# Patient Record
Sex: Female | Born: 1945 | Race: White | Hispanic: No | Marital: Married
Health system: Midwestern US, Community
[De-identification: ages and names within clinical notes are randomized; demographics above are authoritative.]

## PROBLEM LIST (undated history)

## (undated) DIAGNOSIS — M19019 Primary osteoarthritis, unspecified shoulder: Secondary | ICD-10-CM

---

## 2005-11-25 ENCOUNTER — Inpatient Hospital Stay: Attending: Diagnostic Radiology

## 2006-07-07 NOTE — Unmapped (Signed)
Signed by Julious Payer RN on 07/07/2006 at 13:25:16    Phone Note   Patient Call  Call back at Home Phone: [phone]  Caller: patient  Department: Otolaryngology  Call for: ent nurse    Summary of Call: [facility] in [place] is Dr. Berna Spare.  Ms. Mirkin saw him on the street yesterday.     Initial call taken by: Syliva Overman,  July 07, 2006 11:32 AM      Follow-up for Phone Call   Dr Andrena Mews was made aware of the message.  Follow-up by: Julious Payer RN,  July 07, 2006 1:25 PM

## 2006-12-23 ENCOUNTER — Inpatient Hospital Stay: Attending: Diagnostic Radiology

## 2009-03-12 NOTE — Op Note (Unsigned)
PATIENT NAME                  PA #             MR #                  Catherine Roth, Catherine Roth              [id number]       [id number]            SURGEON                               SURG DATE   DIS DATE           Kallie Edward, MD                  03/12/2009                     DATE OF BIRTH    AGE            PATIENT TYPE      RM #               Apr 15, 1946       63             SDA                                     PROCEDURE:  Colonoscopy.     INDICATIONS:  The patient is here for screening colonoscopy.  She is 63 years  of age.  She had a colonoscopy 10 years ago.  Colonoscopy is being done to  make sure that no significant pathology has developed.  She denies any  symptoms.         Following a preoperative assessment of the patient and review of her  pertinent medical history and medications, an informed consent was obtained.   Premedication was administered slowly in incremental dosages.  The patient  was monitored with pulse oximetry and sequential blood pressure checks.     MEDICATIONS:  Fentanyl 75 micrograms and Versed 5 mg.       DETAILS OF PROCEDURE:  The scope was advanced in the cooperative patient  ultimately to the base of the cecum.  The usual cecal landmarks were  identified.  The cecum, ascending colon, hepatic flexure, transverse,  splenic, descending, sigmoid and rectum were all carefully examined.  Prep  was good.  One miniscule questionable polyp was seen in the ascending colon  which was obliterated with 2 bites of the biopsy forceps.  On withdrawal  through the sigmoid colon, a very mild sigmoid diverticulosis was seen.  This  was incidental.  Retroflexion was negative.  No evidence for significant  pathology was seen.  Some external hemorrhoids were present.       PLAN:  A followup exam at this point can be done on a 10-year interval for  screening if biopsies are negative for adenomas and 5 years if there is a  polyp.                                      Kallie Edward, MD     Derwood Kaplan  /[id number]  DD: 03/12/2009  09:33  DT: 03/12/2009 13:16  Job #: [id number]  CC: Kallie Edward, MD  CC: Elon Spanner, MD

## 2011-01-13 NOTE — Procedures (Signed)
[facility] [address]  [place], [place] [address]  [phone]     Electrodiagnostic Medicine  Rehabilitation Services Department      Exam Date: 01/11/2011  Name: Catherine Roth, Catherine Roth Exam Time: 10:14 AM  MR#: [id number] Patient ID: [id number]  Gender:  Date of Birth: [birth year]    Age: 65    Referring Physician: Gladstone Pih M.D.       Clinical Problem:   Patient is a 65 yo WF with hyperlipidemia and prior right THR about 5 yrs. ago.  She complains of right foot drop past six mos. with slight numbness dorsal aspect of foot.  She admits to frequent leg crossing and notes minimal low back pain.  PE reveals weakness right ankle DF/eversion/EHL (4/5).  Reflexes are 1+.      Summary:   1. NCS reveal diminished amplitude of right deep peroneal motor evoked response with associated slowing of right deep peroneal motor nerve conduction velocities.  Moderate right deep peroneal motor conduction block (drop in amplitude) of about 40% is noted across the right knee.  Right superficial peroneal sensory evoked response is mildly diminished in amplitude.   2. Needle EMG reveals mild chronic muscle membrane irritability within distal muscles of the right lower extremity.  Mildly enlarged, polyphasic, voluntary motor unit potentials with slight decreased recruitment are noted in right L5S1 innervated muscles.     Impression:   1. 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    2. 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  Mild chronic ongoing denervation is noted in distal muscles of the right lower extremity.  Chronic reinnervation is noted in all right L5S1 innervated muscles tested.  Difficult to entirely exclude a mild chronic right lumbosacral plexopathy causing similar findings due to normal paraspinal exam although less likely.   3. No evidence of myopathy, diffuse peripheral neuropathy or other focal mononeuropathy involving the right lower extremity.       (Note:  If this report is being viewed under CHART REVIEW/PROCEDURES, table format/data can be better viewed using NOTES/PROCEDURES)    Thank you.             Violet Seabury B. Hal Hope, M.D.      cc:  Dr. Lyda Perone    Motor Nerve Conduction:            Nerve and Site   Lat  ms Amp  mV Segment   Dist  mm Lat Diff  ms CV   m/s   Peroneal.R to Extensor digitorum brevis.R      Ankle 5.5 1.0 Extensor digitorum brevis-Ankle 80 5.5    Fibula (head) 13.7 0.9 Ankle-Fibula (head) 300 8.2 37   Popliteal fossa 16.2 0.6 Ankle-Popliteal fossa 390 10.7 36   Tibial.R to Abductor hallucis.R      Ankle 5.0 6.5 Abductor hallucis-Ankle 80 5.0    Popliteal fossa 13.2 6.1 Ankle-Popliteal fossa 350 8.2 43     Nerve Lat ? ms Amp ? mV CV ? m/s  Median 4.2 4 50  Ulnar 4.2 4 50  Peroneal 5.5 2 40  Tibial 6.1 4 40    F-waves:        Nerve   M-Lat  ms F-Lat  ms        Peroneal.R  NR        Tibial.R  52.7      Sensory and Mixed Nerve Conduction:             Nerve and Site  Peak  Lat ms Amp  V Segment   Lat Diff  ms Dist  mm   Peroneal.R to Ankle.R      Lower leg 3.3 7 Ankle-Lower leg  100   Sural.R to Ankle.R      Lower leg 3.8 6 Ankle-Lower leg  140     Nerve Peak Lat ? ms Amp ? ??V   Median 3.6 20  Ulnar 3.7 20  Radial 2.8 10  Sural 4.0 5  Peroneal 3.5 8    Needle EMG Examination:      Muscle Insertion Spontaneous Activity Volutional MUAPs Comments    Activity Fibs PSW Fasc Other Effort Recruit Dur Amp Poly     Vastus medialis.R Normal 0 0 None  Normal Normal Normal Normal None    Tibialis anterior.R Increased 1+ 1+ None  Normal Reduced Sl Incr Sl Incr Few    Peroneus longus.R Increased 1+ 1+ None  Normal Reduced Sl Incr Sl Incr Few    Gastrocnemius (Medial head).R Increased 1+ 0 None  Normal Reduced Sl Incr Sl Incr Few    Tibialis posterior.R Increased 1+ 0 None  Normal Reduced Sl Incr Sl Incr Few    Biceps femoris (short head).R Normal 0 0 None  Normal Reduced Sl Incr Sl Incr Rare    Gluteus medius.R Normal 0 0 None  Normal Reduced Sl Incr Sl Incr  Rare    Gluteus maximus.R Normal 0 0 None  Normal Reduced Sl Incr Sl Incr Rare    Lumbar paraspinals: .R Normal 0 0 None

## 2012-04-14 NOTE — Unmapped (Signed)
patient called to request an appointment with Dr Lahoma Rocker for evaluation of elevated liver enzymes.  Records from Dr Carollee Massed are being sent to fax # [phone].  patient can be reached at [phone] (home)

## 2012-04-17 NOTE — Unmapped (Signed)
Left a message for patient on her answering machine with available date and time.  Left my direct number for her to confirm appt date and time.  Sent visit info to patient.

## 2012-05-14 ENCOUNTER — Inpatient Hospital Stay
Admit: 2012-05-14 | Discharge: 2012-05-14 | Disposition: A | Discharge status: Home / Self Care | Attending: Emergency Medicine

## 2012-05-14 MED ORDER — BACITRACIN-POLYMYXIN B 500-10000 UNIT/GM EX OINT
500-10000 UNIT/GM | Freq: Once | CUTANEOUS | Status: AC
Start: 2012-05-14 — End: 2012-05-14
  Administered 2012-05-14: 21:00:00 via TOPICAL

## 2012-05-14 MED ORDER — LIDOCAINE-EPINEPHRINE 1 %-1:100000 IJ SOLN
1 %-:00000 | Freq: Once | INTRAMUSCULAR | Status: AC
Start: 2012-05-14 — End: 2012-05-14
  Administered 2012-05-14: 21:00:00 via INTRADERMAL

## 2012-05-14 MED FILL — LIDOCAINE-EPINEPHRINE 1 %-1:100000 IJ SOLN: 1 %-:00000 | INTRAMUSCULAR | Qty: 30

## 2012-05-14 MED FILL — DOUBLE ANTIBIOTIC 500-10000 UNIT/GM EX OINT: 500-10000 UNIT/GM | CUTANEOUS | Qty: 1

## 2012-05-14 NOTE — ED Provider Notes (Addendum)
Date of evaluation: 05/14/2012    Chief Complaint   Head Laceration      Nursing Notes, Past Medical Hx, Past Surgical Hx, Social Hx, Allergies, and Family Hx were all reviewed and agreed with, or any disagreements were addressed in the HPI.    History of Present Illness     Catherine Roth is a 66 y.o. caucasian female with a past medical history of depression and bipolar disorder who presents to the emergency department with 3 small lacerations to her left forehead secondary to a fall while walking her dog this afternoon.  Patient states she tripped over a rock causing her to fall.  She denies any loss of consciousness, visual changes, headache, neck pain, nausea or vomiting, or any other associated symptoms.  Patient states she hasn't eaten anything for the pain thus far.     Review of Systems     Patient denies any chest pain, palpitations, shortness of breath, cough, hemoptysis,abdominal pain, nausea, vomiting, diarrhea, constipation, bowel or bladder incontinence, urinary symptoms including dysuria, urgency, frequency or hematuria. Patient denies any dizziness, lightheadedness, headaches, visual changes, blurred vision, photophobia or syncope. Patient denies any neck pain or stiffness, back pain, paresthesias or weakness.  Remainder of review of systems was reviewed with the patient and was otherwise negative per patient.    Past Medical, Surgical, Family, and Social History     History reviewed. No pertinent past medical history.      Procedure Laterality Date   ??? Hip surgery     ??? Hysterectomy     ??? Tonsillectomy       Her family history is not on file.  She reports that she has never smoked. She does not have any smokeless tobacco history on file. She reports that  drinks alcohol. She reports that she uses illicit drugs.    Medications     Previous Medications    BUPROPION SR (WELLBUTRIN SR) 150 MG SR TABLET    Take 150 mg by mouth daily.    LITHIUM 150 MG CAPSULE    Take 150 mg by mouth 3 times  daily (with meals).    THERAPEUTIC MULTIVITAMIN-MINERALS (THERAGRAN-M) TABLET    Take 1 tablet by mouth daily.       Allergies     She  has no known allergies.    Physical Exam     INITIAL VITALS: BP 126/68   Pulse 92   Temp(Src) 97.7 ??F (36.5 ??C)   Resp 12   Ht 5\' 6"  (1.676 m)   Wt 122 lb (55.339 kg)   BMI 19.7 kg/m2   SpO2 96%   General:   Alert and oriented.  In no acute distress.    HEENT:  Normocephalic, atraumatic.  Sclera are clear bilaterally. PERRLA bilaterally. EOMI bilaterally.No evidence of extraocular motion abnormalities. No obvious step off of the orbit. Oral mucosa is pink and moist without lesions.  Pharynx without erythema or exudate noted.     Neck: Supple.  No lymphadenopathy noted.  Trachea midline.    Pulmonary: Lungs clear to auscultation bilaterally.  No wheezes, rales, rhonchi.     Cardiac: Regular rate and rhythm.  No murmurs, rubs, clicks, gallops.    Abdomen: Soft, nontender, nondistended.  No guarding or rebound.  Positive active bowel sounds in all 4 quadrants.    Musculoskeletal: 2+ pulses to bilateral upper and lower extremities.  Patient is moving all extremities with full range of motion and 5 out of 5 muscle strength.  No  C-spine, T-spine, L-spine tenderness to palpation.  No extremity edema noted.      Vascular:  Good capillary refill.  No cyanosis.     Skin: The superolateral portion of the left orbit has 2 lacerations, one measures approx. 1 cm and is a stellate type lesion and the more lateral laceration is a 1 cm linear lesion, both of which are hemostatic.    Neuro: Alert and oriented x3.  Moving all extremities symmetrically.  Gross sensation and motor is intact.  Ambulating without any difficulty.    ED Course     The patient was given the following medications:  Orders Placed This Encounter   Medications   ??? lidocaine-EPINEPHrine 1 %-1:100000 injection 20 mL     Sig:    ??? bacitracin-polymyxin b (POLYSPORIN) ointment     Sig:      Laceration Repair Procedure  Note    Indication: Laceration    Procedure:  After verbal informed consent was obtained, the patient was placed in the appropriate position and anesthesia around the lacerations were obtained by infiltration lidocaine with epi. The area was then cleaned and irrigated thoroughly. The lacerations was repaired using 5-0 prolene in a simple interrupted fashion. 2 sutures were placed in the stellate shaped lacerations and 4 sutures were placed in the linear laceration.The wound area was then dressed with bacitracin and a clean dressing.    Total repaired wound length: linear laceration was 1.7cm after closure.     The patient tolerated the procedure well.    Complications: None    Catherine Roth is a 65 y.o. female who was out walking her dog when she tripped and fell sustaining 2 small lacerations to her left lateral brow region. Both of the lacerations were repaired with sutures. Good cosmetic results were obtained. Given the patients history we did not feel a Head CT was neccesary at this time. Patient thinks she is up to date on her tetanus but will double check with her PCP. Patient denies any other injury and is walking without difficulty.  Patient will be discharge home in stable condition with wound care instructions.  These instructions were reviewed with the patient at the bedside and she verbalized understanding. Instructed patient to have sutures removed in 7 days and patient states she will be visiting her soon in [place] who is a physician therefore will have him remove them.     The patient tolerated their visit well.  The patient and / or the family were informed of the results of any tests, a time was given to answer questions, a plan was proposed and they agreed with plan.      Patient seen and examined by the attending physician Thomasene Ripple, MD who agrees with my assessment and treatment and plan.    Clinical Impression     1. Fall from other slipping, tripping, or stumbling    2. Facial  laceration        Disposition/Plan     PATIENT REFERRED TO:  Elon Spanner    In 7 days  To have stitches removed      DISCHARGE MEDICATIONS:  New Prescriptions    No medications on file       DISPOSITION Decision to Discharge    ED Attending Attestation Note     Date of evaluation: 05/14/2012    This patient was seen by the mid-level provider.  I have seen and examined the patient, agree with the workup, evaluation, management and  diagnosis. The care plan has been discussed.  My assessment reveals a well-developed, well-nourished Caucasian female in no acute distress.  The superolateral portion of the left orbit has 2 lacerations with a less than 1 cm stellate lesion and a 1 cm linear lesion, both of which are hemostatic.  No evidence of extraocular motion abnormalities.  No obvious step off of the orbit.    I was present for all key portions of the laceration repair by the mid-level provider.    The patient believes that her tetanus status is likely up-to-date and rechecking with her primary care office      Thomasene Ripple, MD  05/14/12 1620    Arelia Sneddon, PA-C  05/19/12 0981    Arelia Sneddon, PA-C  05/20/12 2043

## 2012-05-14 NOTE — ED Notes (Signed)
pso and dressing applied. Pt given all discharge instructions and follow up care. Pt acknowledged understanding of instructions and was able to ambulate out of the ER in stable condition.    Glade Stanford, RN  05/14/12 1721

## 2012-05-14 NOTE — Discharge Instructions (Signed)
1. Apply Polysporin to left facial lacerations multiple times per day.  2. Clean the wounds twice daily with antibacterial soap  3. Change dressing as needed  4. Followup with your primary care physician in 7 days to have sutures removed.  5. Return to the emergency department if you develop any worsening symptoms or concerns including fever greater than 101, increased redness, pus drainage or if you have any other concerns  6. Follow up with her primary care physician to determine if you need your tetanus vaccination updated    Laceration Care, Adult  A laceration is a cut or lesion that goes through all layers of the skin and into the tissue just beneath the skin.  TREATMENT   Some lacerations may not require closure. Some lacerations may not be able to be closed due to an increased risk of infection. It is important to see your caregiver as soon as possible after an injury to minimize the risk of infection and maximize the opportunity for successful closure.  If closure is appropriate, pain medicines may be given, if needed. The wound will be cleaned to help prevent infection. Your caregiver will use stitches (sutures), staples, wound glue (adhesive), or skin adhesive strips to repair the laceration. These tools bring the skin edges together to allow for faster healing and a better cosmetic outcome. However, all wounds will heal with a scar. Once the wound has healed, scarring can be minimized by covering the wound with sunscreen during the day for 1 full year.  HOME CARE INSTRUCTIONS   For sutures or staples:   Keep the wound clean and dry.   If you were given a bandage (dressing), you should change it at least once a day. Also, change the dressing if it becomes wet or dirty, or as directed by your caregiver.   Wash the wound with soap and water 2 times a day. Rinse the wound off with water to remove all soap. Pat the wound dry with a clean towel.   After cleaning, apply a thin layer of the antibiotic  ointment as recommended by your caregiver. This will help prevent infection and keep the dressing from sticking.   You may shower as usual after the first 24 hours. Do not soak the wound in water until the sutures are removed.   Only take over-the-counter or prescription medicines for pain, discomfort, or fever as directed by your caregiver.   Get your sutures or staples removed as directed by your caregiver.  For skin adhesive strips:   Keep the wound clean and dry.   Do not get the skin adhesive strips wet. You may bathe carefully, using caution to keep the wound dry.   If the wound gets wet, pat it dry with a clean towel.   Skin adhesive strips will fall off on their own. You may trim the strips as the wound heals. Do not remove skin adhesive strips that are still stuck to the wound. They will fall off in time.  For wound adhesive:   You may briefly wet your wound in the shower or bath. Do not soak or scrub the wound. Do not swim. Avoid periods of heavy perspiration until the skin adhesive has fallen off on its own. After showering or bathing, gently pat the wound dry with a clean towel.   Do not apply liquid medicine, cream medicine, or ointment medicine to your wound while the skin adhesive is in place. This may loosen the film before your wound is  healed.   If a dressing is placed over the wound, be careful not to apply tape directly over the skin adhesive. This may cause the adhesive to be pulled off before the wound is healed.   Avoid prolonged exposure to sunlight or tanning lamps while the skin adhesive is in place. Exposure to ultraviolet light in the first year will darken the scar.   The skin adhesive will usually remain in place for 5 to 10 days, then naturally fall off the skin. Do not pick at the adhesive film.  You may need a tetanus shot if:   You cannot remember when you had your last tetanus shot.   You have never had a tetanus shot.  If you get a tetanus shot, your arm may swell,  get red, and feel warm to the touch. This is common and not a problem. If you need a tetanus shot and you choose not to have one, there is a rare chance of getting tetanus. Sickness from tetanus can be serious.  SEEK MEDICAL CARE IF:    You have redness, swelling, or increasing pain in the wound.   You see a red line that goes away from the wound.   You have yellowish-white fluid (pus) coming from the wound.   You have a fever.   You notice a bad smell coming from the wound or dressing.   Your wound breaks open before or after sutures have been removed.   You notice something coming out of the wound such as wood or glass.   Your wound is on your hand or foot and you cannot move a finger or toe.  SEEK IMMEDIATE MEDICAL CARE IF:    Your pain is not controlled with prescribed medicine.   You have severe swelling around the wound causing pain and numbness or a change in color in your arm, hand, leg, or foot.   Your wound splits open and starts bleeding.   You have worsening numbness, weakness, or loss of function of any joint around or beyond the wound.   You develop painful lumps near the wound or on the skin anywhere on your body.  MAKE SURE YOU:    Understand these instructions.   Will watch your condition.   Will get help right away if you are not doing well or get worse.  Document Released: 07/05/2005 Document Revised: 09/27/2011 Document Reviewed: 12/29/2010  [facility] Patient Information 2013 [place], [place].

## 2012-05-24 ENCOUNTER — Encounter

## 2012-08-16 ENCOUNTER — Encounter

## 2012-10-11 ENCOUNTER — Ambulatory Visit

## 2013-01-02 NOTE — Telephone Encounter (Signed)
Wife of patient calling.

## 2013-01-02 NOTE — Telephone Encounter (Signed)
Pt is calling to adv JSH "anything that he is needs, I am willing to help him"

## 2014-01-15 ENCOUNTER — Institutional Professional Consult (permissible substitution): Admit: 2014-01-15 | Payer: MEDICARE

## 2014-01-15 DIAGNOSIS — F5081 Binge eating disorder: Secondary | ICD-10-CM

## 2014-01-15 NOTE — Unmapped (Signed)
Pt here for visit. She stopped the Vyvanse in Feb 2015, per my instructions, because she was agitated. Her agitation subsequently resolved. She conts to  binge eating about 2 times/mo for 3-4 days; she describes constant eating during those times. Her weight cycles some as a result. Her binge eating tends to occur when she is stressed or depressed.Regarding mood, she has occasional sad spells during which she misses Lloyd Huger, but no other depressive symptoms. She is sleeping well with the lorazepam. She is functioning well and continues to have great relationships with her children and grandchildren.     MSE: Neatly dressed; good eye contact;  normal mood, affect, and speech; no dels or hals; memory grossly intact;  no SI or HI    ROS: She is in excellent medical health except for some calcification in her heart. She has no known drug allergies. She has recently developed some trouble swallowing and is being evaluated by a GI specialist. She had a normal barium swallow and is to have an endoscopy in August. She has no side effects.     Assessment and Management Plan (Prescribers):   1. dysregulated eating behavior -  cont Wellbutrin, fluoxetine, psychotherapy  2. depressive symptoms - cont Wellbutrin and flouxetine  3. insomnia-cont lorazepam .5 mg every night; cont to stress good sleep hygiene; again warned of side effects of sedation, confusion, and impaired driving  4. impaired attention and focus-to monitor  5. mild obsessive compulsive symptoms (nail biting) - cont to monitor

## 2014-05-15 ENCOUNTER — Ambulatory Visit: Admit: 2014-05-15 | Payer: MEDICARE

## 2014-05-15 DIAGNOSIS — F330 Major depressive disorder, recurrent, mild: Secondary | ICD-10-CM

## 2014-05-15 NOTE — Unmapped (Signed)
Pt here for visit at the recommendation of her internist (her last appt with me was 4 months ago). When she saw Dr Orvil Feil 2 weeks ago, she was feeling depressed and Dr Orvil Feil recommended that she talk to me about Gene Sight testing. Since then, she has started Vitamin D 2000 units/day (for a low Vitamin D level and an 8% bone loss since last year) and started going to bed earlier at 10 PM, and has felt much better.  She is binge eating about 2 times/week. Her weight cycles some as a result but is normal today. Marland Kitchen Her binge eating tends to occur when she is stressed or depressed. She is sleeping well with the lorazepam, and now that she is going to bed at 10 PM, feels refreshed in the AM.. She is functioning well and continues to have great relationships with her children and grandchildren.     Time: 30 minutes    MSE: Neatly dressed; good eye contact;  normal mood, affect, and speech; no dels or hals; memory grossly intact;  no SI or HI    ROS: She is in good  medical health except for some calcification in her heart,  recently discovered low vitamin D, and 8% bone loss since last year.  She has no side effects. Weight=127.8 lbs.    Assessment and Management Plan (Prescribers):   1. dysregulated eating behavior -  cont Wellbutrin and fluoxetine  2. depressive symptoms - cont Wellbutrin and fluoxetine; cont Vitamin D;  GeneSight testing obtained today.  3. insomnia-cont lorazepam .5 mg every night; cont to stress good sleep hygiene; again warned of side effects of sedation, confusion, and impaired driving  4. impaired attention and focus-to monitor  5. mild obsessive compulsive symptoms (nail biting) - cont to monitor

## 2014-05-29 ENCOUNTER — Ambulatory Visit: Payer: PRIVATE HEALTH INSURANCE

## 2014-05-30 ENCOUNTER — Ambulatory Visit: Admit: 2014-05-30 | Payer: MEDICARE

## 2014-05-30 DIAGNOSIS — F5081 Binge eating disorder: Secondary | ICD-10-CM

## 2014-05-30 NOTE — Unmapped (Signed)
Pt here for visit because of increased binge eating and and concerns about her ADD. She states My ADD is interfering with my life.  She also says that when she stayed with her grand children last weekend, she binged much of the time and gained 7.5 lbs in 4 days, which lead to depressed mood and social isolation. Now that she is home, she is in complete control of her eating and her mood is much improved. (She describes her depression at this time as having a bad hour on some days, especially when she drives.) She describes her ADD symptoms as doing too many things at once (though she gets them all done). She has trouble reading but can read a book that interests her.  It is hard for her to sit still.   Lately, her binge eating tends to occur when she is babysitting her grandchildren.  She is sleeping well with the lorazepam and conts to bed at 10 PM. She is functioning well and continues to have great relationships with her children and grandchildren.     Psychotherapy: Continues to worry about how much time she has to live. Discussed that she is a chaotic person who does not do well in chaotic environments  We reviewed her GeneSight testing. She is an ultra-rapid metabolizer of fluoxetine but her bupropion level may be too high and she is at risk for side effects.  She would like to see how she does with decreasing the Wellbutrin XL to 150 mg/d.    Time: 30 minutes; 18 minutes psychotherapy    MSE: Neatly dressed; good eye contact;  normal mood, affect, and speech; no dels or hals; memory grossly intact;  no SI or HI    ROS: She is in good medical health except for some calcification in her heart,  recently discovered low vitamin D, and 8% bone loss since last year.  She has no side effects, except possibly for increased ADD symptoms and anxiety/excessive worrying. Weight=129.4 lbs. She remains on Vitamin D 2000 units/day.    Assessment and Management Plan (Prescribers):   1. dysregulated eating behavior -   Decrease Wellbutrin XL to 150 mg/d  and cont fluoxetine 60 mg/day  2. depressive symptoms - decrease Wellbutrin and cont fluoxetine; cont Vitamin D;  GeneSight testing reviewed with patient today.  3. insomnia-cont lorazepam .5 mg every night; cont to stress good sleep hygiene; again warned of side effects of sedation, confusion, and impaired driving  4. impaired attention and focus- Decrease Wellbutrin XL  to 150 mg/d and cont  to monitor  5. Possible excessive worrying-to decrease Wellbutrin XL to 150 mg/day and to monitor.  6. mild obsessive compulsive symptoms (nail biting) - cont to monitor  7. Ongoing grief issues related to the death of her husband-cont psychotherapy.

## 2014-06-18 ENCOUNTER — Ambulatory Visit: Admit: 2014-06-18

## 2014-06-18 DIAGNOSIS — F3181 Bipolar II disorder: Secondary | ICD-10-CM

## 2014-06-18 NOTE — Unmapped (Signed)
Pt here for visit. She decreased the Wellbutrin XL to 150 mg/day and has since felt more depressed. In addition, over the past 4 days, she has felt wired, has been unable to sit still, has had trouble sleeping, had racing thoughts, and has had constant ideas coming to her mind about an upcoming film about her husband. Her energy was increased but it was not a good energy. She refers to these symptoms as her ADD symptoms, but says they are episodic and agrees that they may represent hypomania/rapid cycling.  During these times she feels scared because she is so out of control and binges. She is functioning well and continues to have great relationships with her children and grandchildren, but is isolating herself from her friends.     Psychotherapy: Discussed her painful childhood, how her mother screamed at her all the time, and how she can always rally and be normal when she is around people, but then feels like a fake. Discussed that she may have bipolar disorder and that we should try a mood stabilizer again. She does not want to take valproate or an antipsychotic because of the weight gain. Decided she would like to try Dierdre Searles again-when tried this in the past, it was helpful but she became Li toxic, and we could never figure out why.     Time: 50 minutes; 30 minutes psychotherapy    MSE: Neatly dressed; good eye contact;  normal mood and affect but speech somewhat tangential; thought process suggests flight of ideas-she quickly tzlks about different topics.no dels or hals; memory grossly intact;  no SI or HI    ROS: She is in good medical health except for some calcification in her heart,  recently discovered low vitamin D, and 8% bone loss since last year.  She has no side effects. Her bllod work from her recent annual visit with Dr. Carollee Massed was WNLs. She remains on Vitamin D 2000 units/day.    Assessment and Management Plan (Prescribers):   1. dysregulated eating behavior -  Cont Wellbutrin XL 150 mg/d  and  cont fluoxetine 60 mg/day  2. depressive symptoms - add lithium 150 mg AM; cont Wellbutrin and cont fluoxetine; cont Vitamin D;  Get Li level and TFTs in 1 week. Return 2 weeks.  3. insomnia-cont lorazepam .5 mg every night; cont to stress good sleep hygiene; again warned of side effects of sedation, confusion, and impaired driving  4. impaired attention and focus- Cont Wellbutrin XL  150 mg/d and observe while on Li  5. Possible excessive worrying-Cont Wellbutrin XL  150 mg/day and observe while on Li.  6. mild obsessive compulsive symptoms (nail biting) - cont to monitor  7. Ongoing grief issues related to the death of her husband-cont psychotherapy  8. Past history of Li toxicity of unknown cause. Monitor closely, get Li level in 1 week

## 2014-07-04 ENCOUNTER — Ambulatory Visit: Payer: MEDICARE

## 2014-07-05 ENCOUNTER — Ambulatory Visit: Admit: 2014-07-05 | Payer: MEDICARE

## 2014-07-05 DIAGNOSIS — F3181 Bipolar II disorder: Secondary | ICD-10-CM

## 2014-07-05 NOTE — Unmapped (Signed)
Pt here for visit. On Wellbutrin XL 300 mg/d, Prozac 60 mg/d, and Li 150 mg/d, the patient states she feels much better with no depression, hypomania, anxiety, or binge eating for the past week. She is sleeping well and has stopped buying things she does not need over the Internet. Her focus and concentration are better. She is socializing more, functioning well and continues to have great relationships with her children and grandchildren.      Time: 25 minutes     MSE: Neatly dressed; good eye contact;  normal mood and affect, normal speech,no dels or hals; memory grossly intact;  no SI or HI    ROS: She is in good medical health except for some calcification in her heart,  Has recently discovered low vitamin D, and 8% bone loss since last year.  She has no side effects except for mild tremor and mild cognitive impairment - it takes her longer to do crossword puzzles and she has had some trouble spelling .  She remains on Vitamin D 2000 units/day. Appetite and weight are stable.    Labs:  07/05/14 wt=129.4 lbs  06/28/14 Li=.2, TSH=.88, T4=5.18    Assessment and Management Plan (Prescribers):   1. dysregulated eating behavior -  Cont Wellbutrin XL 300 mg/d,  fluoxetine 60 mg/day, and lithium 150 mg/d; return 1 month  2. depressive symptoms - cont Wellbutrin XL, fluoxetine, and lithium; cont Vitamin D; return 1 month.  3. insomnia-cont lorazepam .5 mg every night; cont to stress good sleep hygiene; again warned of side effects of sedation, confusion, and impaired driving  4. impaired attention and focus- Cont Wellbutrin XL  300 mg/d and observe while on Li  5. Possible excessive worrying-Cont Wellbutrin XL  300 mg/day and observe while on Li.  6. mild obsessive compulsive symptoms (nail biting) - cont to monitor  7. Ongoing grief issues related to the death of her husband-cont psychotherapy  8. Past history of Li toxicity of unknown cause. Monitor closely.

## 2015-02-21 NOTE — Unmapped (Signed)
OP Treatment Plan    MKENZIE DOTTS  [id number]    [facility] TREATMENT PLAN:   General Goals:  Reduce risk of symptomatic/syndromic/functional relapse or recurrence. Patient will remain psychiatrically stable by reducing or stabilizing his/her signs and symptoms of mental illness and maximizing his/her level of independence. Patient will be able to recognize, accept, and manage his/her mental health and/or substance misuse disorders, including working with the medical staff to manage his/her medications. Improve symptoms/maintain symptom improvement.  Med/Som Specific:  Patient will report all signs and symptoms to the MD/NP. Patient will engage in an informed consent discussion regarding medications for the treatment of their symptoms. Patient will demonstrate adherence to the medication treatment that is established. Patient will report any side effect issues related to the medications.  Therapy Specific:  Patient will attend and be an active participant in individual therapy. Patient will address psychosocial stresses that impact development of symptoms in therapy. Patient will demonstrate increased knowledge and implementation of coping strategies to reduce symptoms and improve functioning.  Expected Frequency of Visits:  2x/year/Ongoing

## 2015-02-26 ENCOUNTER — Ambulatory Visit: Admit: 2015-02-26 | Payer: MEDICARE

## 2015-02-26 DIAGNOSIS — F3181 Bipolar II disorder: Secondary | ICD-10-CM

## 2015-02-26 NOTE — Unmapped (Addendum)
Pt here for visit. She remains on Wellbutrin XL 300 mg/d, Prozac 60 mg/d, Li 150 mg/d, and lorazepam .5 mg qH, and states she has occasional depression and binge eating when stressed, but otherwise is stable. She has had no hypomania or anxiety. The lorazepam continues to be very helpful for sleep. Her energy,  Focus,  and concentration are good. She is functioning well and continues to have great relationships with her children and grandchildren.      Time: 25 minutes     MSE: Neatly dressed; good eye contact;  normal mood and affect, normal speech,no dels or hals; memory grossly intact;  no SI or HI    ROS: She is in good medical health except for some calcification in her heart.  She has no side effects except for mild tremor (which is not apparent today) and mild cognitive impairment - it takes her longer to do crossword puzzles and she has had some trouble spelling.  She remains on Vitamin D 2000 units/day. Appetite and weight are stable.    Labs:  02/26/15 wt=126 lbs  07/05/14 wt=129.4 lbs  06/28/14 Li=.2, TSH=.88, T4=5.18    Assessment and Management Plan (Prescribers):   1. dysregulated eating behavior -  Cont Wellbutrin XL 300 mg/d,  fluoxetine 60 mg/day; Increase lithium 150 mg BID; get repeat labs in 10/16 when she has her annual physical  2. depressive symptoms - cont Wellbutrin XL, fluoxetine, increase lithium; cont Vitamin D.  3. insomnia-cont lorazepam .5 mg every night; cont to stress good sleep hygiene; again warned of side effects of sedation, confusion, and impaired driving  4. impaired attention and focus- Cont Wellbutrin XL  300 mg/d and observe while on Li  5. Possible excessive worrying-Cont Wellbutrin XL  300 mg/day and observe while on Li.  6. mild obsessive compulsive symptoms (nail biting) - cont to monitor  7. Ongoing grief issues related to the death of her husband-cont psychotherapy  8. Past history of Li toxicity of unknown cause. Monitor closely. Pt again educated about the signs of Li  toxicity.

## 2015-03-07 MED ORDER — FLUoxetine (PROZAC) 20 MG tablet
20 | ORAL_TABLET | Freq: Every day | ORAL | Status: AC
Start: 2015-03-07 — End: 2016-10-19

## 2015-06-17 ENCOUNTER — Ambulatory Visit: Admit: 2015-06-17 | Payer: MEDICARE

## 2015-06-17 DIAGNOSIS — F5081 Binge eating disorder: Secondary | ICD-10-CM

## 2015-06-17 NOTE — Unmapped (Addendum)
Pt here for visit. She remains on Wellbutrin XL 300 mg/d, Prozac 60 mg/d, Li 150 mg/d (she did not increase the lithium to 300 mg/d), and lorazepam .5 mg qHS, and states she has occasional depression or  giddiness (hypomania), and intermittent trouble focusing and concentrating, but otherwise is stable. She has had minimal anxiety and the  lorazepam continues to be very helpful for sleep. Her energy is  good. She is functioning well and continues to have great relationships with her children and grandchildren. Her major concern is that her binge eating occurs frequently whenever she is not on her own--when she is visiting or traveling.. Over Thanksgiving, which she spent with her daughter and grandchildren, she binged every day for 4 days. Since coming home and getting back to her routine, she has stopped binge eating.     Psychotherapy: we reviewed treatment options for her binge eating, including CBT and taking Vyvanse or naltrexone on bad days, but not on days that she feels in control and is not binge eating. She elected to try naltrexone again; she is to take naltrexone 25 mg/d on days she is visiting or traveling. And not on days when she feels her eating is under control.     Time: 45 minutes; psychotherapy-30 minutes     MSE: Neatly dressed; good eye contact; normal mood and affect, normal speech, no dels or hals; alert and Ox3; memory grossly intact; no SI or HI; good judgement and insight    ROS: She remains  in good medical health except for some calcification in her heart.  Her annual physical exam and lab tests (including LFTs)  this past 10/16 were normal. She does have pain from arthritis in both shoulders and is seeing an orthopedist for this. She has no side effects except for mild tremor (which is not apparent today) and mild cognitive impairment - it takes her longer to do crossword puzzles and she has had some trouble spelling.  She remains on Vitamin D 2000 units/day. Appetite and weight are  stable. There has been no purging.     Weight/Labs:  06/17/15 wt=130.4 lbs  02/26/15 wt=126 lbs  07/05/14 wt=129.4 lbs  06/28/14 Li=.2, TSH=.88, T4=5.18    Assessment and Management Plan (Prescribers):   1. dysregulated eating behavior -  Cont Wellbutrin XL 300 mg/d,  fluoxetine 60 mg/day;  lithium 150 mg BID; add naltrexone 25 mg/d only on stressful days.  2. depressive symptoms with infrequent brief hypomanic symptoms - cont Wellbutrin XL, fluoxetine, lithium; cont Vitamin D.  3. insomnia-cont lorazepam .5 mg every night; cont to stress good sleep hygiene; again warned of side effects of sedation, confusion, and impaired driving  4. impaired attention and focus- Cont Wellbutrin XL 300 mg/d  5. mild obsessive compulsive symptoms (nail biting) - cont to monitor  6. Ongoing grief issues related to the death of her husband-cont psychotherapy  7. Past history of Li toxicity of unknown cause. Monitor closely. Pt again educated about the signs of Li toxicity.  8. History of increased LFTs on naltrexone - to obtain recent labs from Dr. Arnell Asal office

## 2015-06-25 ENCOUNTER — Encounter

## 2015-11-04 ENCOUNTER — Ambulatory Visit: Admit: 2015-11-04 | Payer: MEDICARE

## 2015-11-04 DIAGNOSIS — M25512 Pain in left shoulder: Secondary | ICD-10-CM

## 2015-11-04 NOTE — Progress Notes (Signed)
REFERRING PHYSICIAN: Les Pou. Erasmo Downer.    CHIEF COMPLAINT:  Left shoulder pain     HISTORY OF PRESENT ILLNESS:  Catherine Roth is a 70 year old right-hand dominant female presents today at the request of Les Pou. Erasmo Downer. for evaluation consultation of her left shoulder.  [name] had an injury 8 years ago when she fell onto her left shoulder.  She states that her pain and range of motion have worsened over the last 6 months to a year.  She had an injection for 5 months ago that helped for a couple weeks.  She also states that she has begun to have right shoulder discomfort as well.  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 Lyda Perone.  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  Other activities include biking, elliptical, gym workouts, and golfing.    REVIEW OF SYSTEMS:  Relevant review of systems reviewed and available in the patient's chart.    PHYSICAL EXAMINATION: Inspection of the left shoulder reveals warm, dry, intact skin.  There is no adenopathy. The distal neurovascular exam is grossly intactrange of motion reveals 90?? of active forward elevation, 120?? of passive forward elevation.  She has 45?? of external rotation with the outside.  She has 70?? of shoulder abduction.  She has 10?? of external rotation and 0?? of internal rotation with the arm maximally abducted.  Severe crepitance is noted with active and passive range of motion.  She has 4+ out of 5 strength in forward elevation and external rotation.  She has negative Yergason's test.  Sensation about the lateral aspect of the left shoulder and axillary nerve distribution pattern is intact.  She is able to actively contract her deltoid.     Examination of the contralateral shoulder reveals no atrophy or deformity. The skin is warm and dry. Range of motion is within normal limits. There is no focal tenderness with palpation. Provocative SLAP, biceps tension, apprehension AC joint or rotator cuff tests are negative.  Strength is graded 5/5 in all muscle groups. The distal neurovascular exam is grossly intact.    Cervical spine: The skin is warm and dry. There is no swelling, warmth, or erythema. Range of motion is limited in extension, right and left lateral flexion, and right and left rotation. There is no paraspinal or spinous process tenderness. Spurling's sign is negative and did not produce shoulder pain. The distal neurovascular exam is grossly intact.    X-RAYS: 4 views of the left shoulder were obtained in the office and reviewed today.  Glenohumeral joint is diminished.  There are cystic changes about the glenoid as well as the humeral head.   there is superior glenoid erosion visualized.  The acromiohumeral interval is diminished.  There are osteophytes visualized about the greater tuberosity as well as lateral aspect of the acromion.  The scapulohumeral arch is disrupted with superior elevation humeral head.  The acromial clavicular joint is diminished inferiorly with osteophyte formation visualized at the superior aspect of the distal clavicle.  Type II acromion visualized in the outlet view.  Lung fields are clear with normal pulmonary markings.    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    PLAN: I did detailed discussion with Catherine Roth regarding diagnosis and treatment options.  She is not currently happy with her shoulder and like proceed with left reverse total shoulder arthroplasty, possible glenoid bone  grafting, and biceps tenodesis.  Surgery was scheduled a mutually convenient time.  She is in agreement this plan.    I discussed the risks of surgery which include but are not limited to infection, bleeding, nerve injury resulting in motor or sensory loss, DVT, RSD, persistent pain, loss of motion, functional instability, and need for further surgery. At no time were any guarantees implied or stated. The patient  acknowledged these risks and electively reviewed and signed the consent form.        Procedures   ??? Sling Shot II Breg Brace     Patient was prescribed a Breg Sling Shot II Shoulder Brace.   The left shoulder will require stabilization / immobilization from this orthosis.  The orthosis will assist in protecting the affected area, provide functional support and facilitate healing.  The device was ordered and fit on 11/04/15.    The patient was educated and fit by a Designer, fashion/clothinghealthcare professional with expert knowledge and specialization in brace application while under the direct supervision of the treating physician.  Verbal and written instructions for the use of and application of this item were provided.   They were instructed to contact the office immediately should the brace result in increased pain, decreased sensation, increased swelling or worsening of the condition.     Skeet Latchave Envy Meno PA-C dictating for Marcos EkePaul Favorito, MD

## 2015-11-04 NOTE — Telephone Encounter (Signed)
[id number]  [id number]   AUTH-NPR

## 2015-11-26 ENCOUNTER — Ambulatory Visit: Admit: 2015-11-26

## 2015-11-26 DIAGNOSIS — R69 Illness, unspecified: Secondary | ICD-10-CM

## 2015-11-26 LAB — APTT: aPTT: 29 seconds (ref 23.1–37.6)

## 2015-11-26 LAB — URINALYSIS
Bilirubin Urine: NEGATIVE
Erythrocytes, Urine: NEGATIVE
Glucose, Ur: NEGATIVE mg/dL
Ketones, Urine: NEGATIVE mg/dL
Leukocyte Esterase, Urine: NEGATIVE
Nitrite, Urine: NEGATIVE
Protein, UA: NEGATIVE mg/dL
Specific Gravity, UA: 1.011 (ref 1.005–1.035)
Urobilinogen, Urine: <2 mg/dL (ref <2.0)
pH, UA: 5 (ref 5.0–8.0)

## 2015-11-26 LAB — CBC
Hematocrit: 38.5 % (ref 35.0–45.0)
Hemoglobin: 12.8 g/dL (ref 11.7–15.5)
MCH: 31.6 pg (ref 27.0–33.0)
MCHC: 33.3 g/dL (ref 32.0–36.0)
MCV: 95.1 fL (ref 80.0–100.0)
MPV: 8.3 fL (ref 7.5–11.5)
Platelets: 248 10*3/uL (ref 140–400)
RBC Morphology: 4.05 10*6/uL (ref 3.80–5.10)
RDW: 13.3 % (ref 11.0–15.0)
WBC: 4.7 10*3/uL (ref 3.8–10.8)

## 2015-11-26 LAB — PROTIME-INR
INR: 1 (ref 0.9–1.1)
Protime: 11.3 seconds (ref 9.4–12.6)

## 2015-11-26 LAB — BASIC METABOLIC PANEL
Anion Gap: 7 mmol/L (ref 5–13)
BUN/Creatinine Ratio: 21
BUN: 16 mg/dL (ref 7–25)
CO2: 25 mmol/L (ref 22–29)
Calcium: 9.9 mg/dL (ref 8.4–10.5)
Chloride: 109 mmol/L (ref 98–110)
Creatinine: 0.76 mg/dL (ref 0.50–1.20)
GFR African American: 91 See Note
GFR Non-African American: 75 See Note
Glucose: 75 mg/dL (ref 70–99)
Potassium: 4.2 mmol/L (ref 3.5–5.1)
Sodium: 141 mmol/L (ref 135–146)

## 2015-11-26 LAB — ANTIBODY SCREEN: Antibody Screen: NEGATIVE

## 2015-11-26 LAB — ABO/RH: Rh Factor: POSITIVE

## 2015-11-26 NOTE — Unmapped (Signed)
Comp care fee

## 2015-11-27 ENCOUNTER — Inpatient Hospital Stay: Attending: Rehabilitative and Restorative Service Providers"

## 2015-11-27 ENCOUNTER — Encounter

## 2015-11-27 LAB — STAPH AUREUS SCREEN
STAPH AUREUS.METHICILLIN RESISTANT DNA: NEGATIVE
Staph Aureus Sc: POSITIVE — AB

## 2015-11-28 LAB — CULTURE, URINE

## 2015-12-01 MED ORDER — MUPIROCIN CALCIUM 2 % NA OINT
2 % | NASAL | 3 refills | Status: AC
Start: 2015-12-01 — End: ?

## 2015-12-11 ENCOUNTER — Encounter

## 2015-12-19 ENCOUNTER — Inpatient Hospital Stay: Admit: 2015-12-19 | Attending: Rehabilitative and Restorative Service Providers"

## 2015-12-19 ENCOUNTER — Ambulatory Visit: Admit: 2015-12-19 | Payer: MEDICARE

## 2015-12-19 ENCOUNTER — Ambulatory Visit: Admit: 2015-12-19 | Discharge: 2015-12-19 | Payer: MEDICARE

## 2015-12-19 DIAGNOSIS — M25512 Pain in left shoulder: Secondary | ICD-10-CM

## 2015-12-19 NOTE — Plan of Care (Signed)
[facility], [place] - [facility], [place]  [address]  [place], [place]  [address]  Phone: [phone]  Fax [phone]     Physical Therapy Certification    Dear Referring Practitioner: Dr. Marilynne Drivers,    We had the pleasure of evaluating the following patient for physical therapy services at [facility].  A summary of our findings can be found in the initial assessment below.  This includes our plan of care.  If you have any questions or concerns regarding these findings, please do not hesitate to contact me at the office phone number checked above.  Thank you for the referral.       Physician Signature:_______________________________Date:__________________  By signing above (or electronic signature), therapist???s plan is approved by physician    Patient: Catherine Roth   DOB: 05-29-1946   MRN: [id number]  Referring Physician: Referring Practitioner: Dr. 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  Continued to get worse and noticed less function.  Unable to raise arm and increase in OA.  Decided to pursue surgical intervention 12/10/15.  Saw MD this morning and allowed to wean out of sling.  Sleeping in bed but with difficulty.  Reports difficulty sleeping regardless of shoulder.  Pain controlled with use of Tylenol.  Using cooling system often throughout the day.   RHD.  Compliant with use of  the sling. Denies any n/t in the hand.   No prior shoulder surgeries.      Relevant Medical History:Right THR  Functional Disability Index:PT G-Codes  Functional Assessment Tool Used: Quick Dash   Score: 61%  Functional Limitation: Carrying, moving and handling objects  Carrying, Moving and Handling Objects Current Status (P2951): At least 60 percent but less than 80 percent impaired, limited or restricted  Carrying, Moving and Handling Objects Goal Status [phone]): At least 20 percent but less than 40 percent impaired, limited or restricted    Pain Scale: 4-6/10  Easing factors: Tylenol, icing  Provocative factors: Movements, minimal provacative factors at this time secondary to sling use.  Prior to surgery - movement and overhead activity     Type: Constant   Intermittent  Radiating Localized other:     Numbness/Tingling: Denies     Occupation/School: Retired    Engineer, building services Level of Function: Independent with ADLs and IADLs, pilates, gym, golf    OBJECTIVE:     CERV ROM     Cervical Flexion     Cervical Extension     Cervical SB Tight UT    Cervical rotation          ROM Left Right   Shoulder Flex PROM 124 deg  Shoulder Abd     Shoulder ER PROM 10 deg w/ no abduction    Shoulder IR                    Strength  Left Right   Shoulder Flex NT secondary to surgery precautions    Shoulder Scap     Shoulder ER     Shoulder IR                 Reflexes/Sensation: NT   [] Dermatomes/Myotomes intact    [] Reflexes equal and normal bilaterally   [] Other:    Joint mobility:    [] Normal    [x] Hypo   [] Hyper    Palpation:     Functional Mobility/Transfers: Independent     Posture: Keeps left arm rested in the body or on her lap in sitting     Bandages/Dressings/Incisions: Incision healing well, no signs of active drainage or infection     Gait: (include devices/WB status): WNL, ambulates into clinic carrying sling in the left hand    Orthopedic Special Tests: NA secondary to surgical intervention                         [x]  Patient history, allergies, meds reviewed. Medical chart reviewed. See intake form.     Review Of Systems (ROS):  [x] Performed Review of systems (Integumentary, CardioPulmonary, Neurological) by intake and observation. Intake form has been scanned into medical record. Patient has been instructed to contact their primary care physician regarding ROS issues if not already being addressed at this time.      Co-morbidities/Complexities (which will affect course of rehabilitation):   [] None           Arthritic conditions   [] Rheumatoid arthritis (M05.9)  [x] Osteoarthritis (M19.91)   Cardiovascular conditions   [] Hypertension (I10)  [] Hyperlipidemia (E78.5)  [] Angina pectoris (I20)  [] Atherosclerosis (I70)   Musculoskeletal conditions   [] Disc pathology   [] Congenital spine pathologies   [] Prior surgical intervention  [] Osteoporosis (M81.8)  [] Osteopenia (M85.8)   Endocrine conditions   [] Hypothyroid (E03.9)  [] Hyperthyroid Gastrointestinal conditions   [] Constipation (K59.00)   Metabolic conditions   [] Morbid obesity (E66.01)  [] Diabetes type 1(E10.65) or 2 (E11.65)   [] Neuropathy (G60.9)     Pulmonary conditions   [] Asthma (J45)  [] Coughing   [] COPD (J44.9)   Psychological Disorders  [x] Anxiety (F41.9)  [x] Depression (F32.9)   [] Other:   [x] Other:   Right THR 2005       Barriers to/and or personal factors that will affect rehab potential:              [] Age  [] Sex              [] Motivation/Lack of Motivation                        [] Co-Morbidities              [] Cognitive Function, education/learning barriers              [] Environmental, home barriers              [] profession/work barriers  [] past PT/medical experience  [] other:  Justification:      Falls Risk Assessment (30 days):   [x]  Falls Risk assessed and no intervention required.  []  Falls Risk assessed and Patient requires intervention due to being higher risk   TUG score (>12s at risk):     []  Falls  education provided, including       G-Codes:   PT G-Codes  Functional Assessment Tool Used: Neldon Mc   Score: 61%  Functional Limitation: Carrying, moving and handling objects  Carrying, Moving and Handling Objects Current Status (Z6109): At least 60 percent but less than 80 percent impaired, limited or restricted  Carrying, Moving and Handling Objects Goal Status [phone]): At least 20 percent but less than 40 percent impaired, limited or restricted    ASSESSMENT:   Functional Impairments   [x] Noted spinal or UE joint hypomobility   [] Noted spinal or UE joint hypermobility   [x] Decreased UE functional ROM   [x] Decreased UE functional strength   [] Abnormal reflexes/sensation/myotomal/dermatomal deficits   [x] Decreased RC/scapular/core strength and neuromuscular control   [] other:      Functional Activity Limitations (from functional questionnaire and intake)   [x] Reduced ability to tolerate prolonged functional positions   [x] Reduced ability or difficulty with changes of positions or transfers between positions   [x] Reduced ability to maintain good posture and demonstrate good body mechanics with sitting, bending, and lifting   [x]  Reduced ability or tolerance with driving and/or computer work   [x] Reduced ability to sleep   [x] Reduced ability to perform lifting, reaching, carrying tasks   [x] Reduced ability to tolerate impact through UE   [x] Reduced ability to reach behind back   [x] Reduced ability to grip or hold objects   [x] Reduced ability to throw or toss an object   [] other:    Participation Restrictions   [x] Reduced participation in self care activities   [x] Reduced participation in home management activities   [] Reduced participation in work activities   [x] Reduced participation in social activities.   [x] Reduced participation in sport/recreation activities.    Classification:   [x] Signs/symptoms consistent with post-surgical status including decreased ROM, strength and function.  [] Signs/symptoms consistent with joint sprain/strain   [] Signs/symptoms  consistent with shoulder impingement   [] Signs/symptoms consistent with shoulder/elbow/wrist tendinopathy   [x] Signs/symptoms consistent with Rotator cuff tear   [] Signs/symptoms consistent with labral tear   [] Signs/symptoms consistent with postural dysfunction    [] Signs/symptoms consistent with Glenohumeral IR Deficit - <45 degrees   [] Signs/symptoms consistent with facet dysfunction of cervical/thoracic spine    [] Signs/symptoms consistent with pathology which may benefit from Dry needling     [] other:     Prognosis/Rehab Potential:      [] Excellent   [x] Good    [] Fair   [] Poor    Tolerance of evaluation/treatment:    [x] Excellent   [] Good    [] Fair   [] Poor  Physical Therapy Evaluation Complexity Justification  [x]  A history of present problem with:  []  no personal factors and/or comorbidities that impact the plan of care;  [x] 1-2 personal factors and/or comorbidities that impact the plan of care  [] 3 personal factors and/or comorbidities that impact the plan of care  [x]  An examination of body systems using standardized tests and measures addressing any of the following: body structures and functions (impairments), activity limitations, and/or participation restrictions;:  []  a total of 1-2 or more elements   [x]  a total of 3 or more elements   []  a total of 4 or more elements   [x]  A clinical presentation with:  [x]  stable and/or uncomplicated characteristics   []  evolving clinical presentation with changing characteristics  []  unstable and unpredictable characteristics;   [x]  Clinical decision making of [x]  low, []  moderate, []  high complexity using standardized patient assessment instrument and/or measurable assessment of functional outcome.    [x]  EVAL (LOW)  [id number] (typically 20 minutes face-to-face)  []  EVAL (MOD) 9147897162 (typically 30 minutes face-to-face)  []  EVAL (HIGH) 97163 (typically 45 minutes face-to-face)  []  RE-EVAL       PLAN:  Frequency/Duration:  2 days per week for 12  Weeks:  INTERVENTIONS:  [x]  Therapeutic exercise including: strength training, ROM, for Upper extremity and core   [x]   NMR activation and proprioception for UE, scap and Core   [x]  Manual therapy as indicated for shoulder, scapula and spine to include: Dry Needling/IASTM, STM, PROM, Gr I-IV mobilizations, manipulation.   [x]  Modalities as needed that may include: thermal agents, E-stim, Biofeedback, US, iontophoresis as indicated  [x]  Patient education on joint protection, postural re-education, activity modification, progression of HEP.    HEP instruction: (see scanned forms)    GOALS:  Patient stated goal: Return to recreational activities of golf and pilates     Therapist goals for Patient:   Short Term Goals: To be achieved in: 2 weeks  1. Independent in HEP and progression per patient tolerance, in order to prevent re-injury.   2. Patient will have a decrease in pain to facilitate improvement in movement, function, and ADLs as indicated by Functional Deficits.    Long Term Goals: To be achieved in: 12 weeks  1. Disability index score of 25% or less for the [facility] to assist with reaching prior level of function.   2. Patient will demonstrate increased AROM to 120 deg flexion to allow for proper joint functioning as indicated by patients Functional Deficits.   3. Patient will return to ADLs/ functional activities without increased symptoms or restriction.   4. Patient will return to recreational activities through safe progression and appropriate modifications.        Electronically signed by:  Alanson Pulsiffany Amylah Will, PT [phone]

## 2015-12-19 NOTE — Other (Signed)
[facility] - [facility], [place]  [address], [address]  [place], [place]  [id number]  Phone: [phone]  Fax [phone]    Physical Therapy Daily Treatment Note  Date:  12/19/2015    Patient Name:  Catherine HeadingCarol Roth    DOB:  09/25/1945  MRN: [id number]  Restrictions/Precautions:    Physician Information:  Referring Practitioner: Dr. Marilynne DriversFavorito  Medical/Treatment Diagnosis Information:  ?? 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 Treatment Diagnosis:Left Shoulder Pain (M25.512) / Left shoulder stiffness (M25.612)   []  Conservative / [x]  Surgical - DOS: 12/10/15  Therapy Diagnosis/Practice Pattern:  Practice Pattern H: Joint Arthroplasty  Insurance/Certification information:  PT Insurance Information: MEDICARE  Plan of care signed: []  YES  [x]  NO  Number of Comorbidities:  [] 0     [x] 1-2    [] 3+  Date of Patient follow up with Physician: 02/03/16     G-Code (if applicable):      Date G-Code Applied:  12/19/15  PT G-Codes  Functional Assessment Tool Used: Neldon McQuick Dash   Score: 61%  Functional Limitation: Carrying, moving and handling objects  Carrying, Moving and Handling Objects Current Status (G4010(G8984): At least 60 percent but less than 80 percent impaired, limited or restricted  Carrying, Moving and Handling Objects Goal Status [phone](G8985): At least 20 percent but less than 40 percent impaired, limited or restricted    Progress Note: [x]   Yes  []   No  Next due by: Visit #10        Latex Allergy:  [x] NO      [] YES  Preferred Language for Healthcare:   [x] English       [] other:    Visit # Insurance Allowable Reporting Period   1 MEDICARE Begin Date: 12/19/2015               End Date:      RECERT DUE BY: 03/12/16    Pain level:  4-6/10     SUBJECTIVE:  See eval    OBJECTIVE: See eval  Observation:  Palpation:     Test used Initial score Current Score   Pain Summary  4-6/10    Functional questionnaire Quick Dash  61%    ROM PROM Flexion  124 deg     PROM  ER 10 deg    Strength               RESTRICTIONS/PRECAUTIONS: Reverse TSA precautions : No ER > 30 deg, No IR resistance x 6 weeks     Exercises/Interventions:   Therapeutic Ex Sets/reps Notes   Table slide flexion 10 x     SBS 10 x     Shrugs 10 x     pendulum reviewed    Elbow AAROM 10 x     Self PROM ER stretch  10 x                                                                      Manual Intervention     PROM, joint osscillations  X 10 minutes NO ER> 30 deg, no IR resitance  NMR re-education                                                 Therapeutic Exercise and NMR EXR   [phone]) Provided verbal/tactile cueing for activities related to strengthening, flexibility, endurance, ROM  for improvements in scapular, scapulothoracic and UE control with self care, reaching, carrying, lifting, house/yardwork, driving/computer work.     [phone]) Provided verbal/tactile cueing for activities related to improving balance, coordination, kinesthetic sense, posture, motor skill, proprioception  to assist with  scapular, scapulothoracic and UE control with self care, reaching, carrying, lifting, house/yardwork, driving/computer work.    Therapeutic Activities:     [phone] or [phone]) Provided verbal/tactile cueing for activities related to improving balance, coordination, kinesthetic sense, posture, motor skill, proprioception and motor activation to allow for proper function of scapular, scapulothoracic and UE control with self care, carrying, lifting, driving/computer work.     Home Exercise Program:     [phone]) Reviewed/Progressed HEP activities related to strengthening, flexibility, endurance, ROM of scapular, scapulothoracic and UE control with self care, reaching, carrying, lifting, house/yardwork, driving/computer work   (13086) Reviewed/Progressed HEP activities related to improving balance, coordination, kinesthetic sense, posture, motor skill, proprioception of scapular,  scapulothoracic and UE control with self care, reaching, carrying, lifting, house/yardwork, driving/computer work      Manual Treatments:  PROM / STM / Oscillations-Mobs:  G-I, II, III, IV (PA's, Inf., Post.)   (97140) Provided manual therapy to mobilize soft tissue/joints of cervical/CT, scapular GHJ and UE for the purpose of modulating pain, promoting relaxation,  increasing ROM, reducing/eliminating soft tissue swelling/inflammation/restriction, improving soft tissue extensibility and allowing for proper ROM for normal function with self care, reaching, carrying, lifting, house/yardwork, driving/computer work    Modalities:  Declined    Charges:  Timed Code Treatment Minutes: 25   Total Treatment Minutes: 45      EVAL (LOW) 97161 (typically 20 minutes face-to-face)   EVAL (MOD) 57846 (typically 30 minutes face-to-face)   EVAL (HIGH) 97163 (typically 45 minutes face-to-face)   RE-EVAL      NG(29528) x  1    IONTO   NMR (97112) x       VASO   Manual (97140) x  1     Other:   TA x        [employer] (41324)   ES(attended) (40102)       ES (un) (72536):     GOALS:  Patient stated goal: Return to recreational activities of golf and pilates   ??  Therapist goals for Patient:   Short Term Goals: To be achieved in: 2 weeks  1. Independent in HEP and progression per patient tolerance, in order to prevent re-injury.   2. Patient will have a decrease in pain to facilitate improvement in movement, function, and ADLs as indicated by Functional Deficits.  ??  Long Term Goals: To be achieved in: 12 weeks  1. Disability index score of 25% or less for the [facility] to assist with reaching prior level of function.   2. Patient will demonstrate increased AROM to 120 deg flexion to allow for proper joint functioning as indicated by patients Functional Deficits.   3. Patient will return to ADLs/ functional activities without increased symptoms or restriction.   4. Patient will return to recreational  activities through safe progression and appropriate modifications.    ??  New or Updated Goals (if applicable):  [x]  No change to goals established upon initial eval/last progress note:  New Goals:    Progression Towards Functional goals:   []  Patient is progressing as expected towards functional goals listed.    []  Progression is slowed due to complexities listed.  []  Progression has been slowed due to co-morbidities.  [x]  Plan just implemented, too soon to assess goals progression  []  Other:     ASSESSMENT:    []  Improvement noted relative to goals:  []  No Improvement noted related to goals:  Summary/Patient's response to treatment: See Eval    Treatment/Activity Tolerance:  [x]  Patient tolerated treatment well []  Patient limited by fatique  []  Patient limited by pain  []  Patient limited by other medical complications  []  Other:     Prognosis: [x]  Good []  Fair  []  Poor    Patient Requires Follow-up: [x]  Yes  []  No    PLAN: Patient requested to follow up at [facility] location - patient given phone number to call for scheduling   []  Continue per plan of care []  Alter current plan (see comments)  [x]  Plan of care initiated []  Hold pending MD visit []  Discharge    Electronically signed by: Alanson Puls, PT [phone]

## 2015-12-19 NOTE — Progress Notes (Signed)
HISTORY OF PRESENT ILLNESS: The patient returns today for the first postoperative visit after left reverse total shoulder arthroplasty. Pain control has been satisfactory with oral medications. There have been no fevers or chills. Home exercises have been performed as instructed.    PHYSICAL EXAMINATION: Inspection of the affected left shoulder reveals expected swelling. The incision is clean and dry. The skin is warm. Range of motion is limited by pain and swelling as expected. The deltoid contracts.  The distal neurovascular exam is grossly intact.     X-RAYS:  True AP and axillary views of the left shoulder reveal appropriately placed, well located reverse total shoulder arthroplasty components.      ASSESSMENT/PLAN: Doing well after left reverse total shoulder arthroplasty. The UltraSling will be minimized over the next few days.  I reviewed  the standard postoperative exercises and provided them with a prescription for physical therapy. I have recommended ice and judicious use of narcotics as required.  They will follow up in approximately six weeks for repeat evaluation.

## 2015-12-24 ENCOUNTER — Inpatient Hospital Stay: Attending: Rehabilitative and Restorative Service Providers"

## 2015-12-24 NOTE — Other (Addendum)
[facility] ??? Orthopaedics and Sports Rehabilitation, [place]  [address]    [place], [place] [address]  Phone: [phone]   Fax:     [phone]  Physical Therapy Daily Treatment Note  Date:  12/24/2015    Patient Name:  Catherine Roth    DOB:  1946/05/22  MRN: [id number]  Restrictions/Precautions:    Physician Information:  Referring Practitioner: Dr. Marilynne Drivers  Medical/Treatment Diagnosis Information:  ?? 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 Treatment Diagnosis:Left Shoulder Pain (M25.512) / Left shoulder stiffness (M25.612)    Conservative /  Surgical - DOS: 12/10/15  Therapy Diagnosis/Practice Pattern:  Practice Pattern H: Joint Arthroplasty  Insurance/Certification information:  PT Insurance Information: MEDICARE  Plan of care signed:  YES   NO  Number of Comorbidities:  0     1-2    3+  Date of Patient follow up with Physician: 02/03/16     G-Code (if applicable):      Date G-Code Applied:  2016/01/18  PT G-Codes  Functional Assessment Tool Used: Neldon Mc   Score: 61%  Functional Limitation: Carrying, moving and handling objects  Carrying, Moving and Handling Objects Current Status (U7253): At least 60 percent but less than 80 percent impaired, limited or restricted  Carrying, Moving and Handling Objects Goal Status [phone]): At least 20 percent but less than 40 percent impaired, limited or restricted    Progress Note:   Yes    No  Next due by: Visit #10        Latex Allergy:  NO      YES  Preferred Language for Healthcare:   English       other:    Visit # Insurance Allowable Reporting Period   2 MEDICARE Begin Date: 12/24/2015               End Date:      RECERT DUE BY: 03/12/16    Pain level:  4-6/10     SUBJECTIVE:  "doing ok.. Sore. Not sleeping"     OBJECTIVE:  15 degrees er @ 0   Observation:  Palpation:     Test used Initial score Current Score   Pain Summary  4-6/10    Functional questionnaire Quick Dash   61%    ROM PROM Flexion  124 deg     PROM ER 10 deg    Strength               RESTRICTIONS/PRECAUTIONS: Reverse TSA precautions : No ER > 30 deg, No IR resistance x 6 weeks     Exercises/Interventions:   Therapeutic Ex Sets/reps Notes   Table slide flexion 10 x     SBS 10 x     Shrugs 10 x     pendulum reviewed    Elbow AAROM 10 x     Self PROM ER stretch  10 x                                                                      Manual Intervention     PROM, joint osscillations  X 20 minutes NO ER> 30 deg, no IR resitance  NMR re-education                                                 Therapeutic Exercise and NMR EXR  [x]  [phone]) Provided verbal/tactile cueing for activities related to strengthening, flexibility, endurance, ROM  for improvements in scapular, scapulothoracic and UE control with self care, reaching, carrying, lifting, house/yardwork, driving/computer work.    []  [phone]) Provided verbal/tactile cueing for activities related to improving balance, coordination, kinesthetic sense, posture, motor skill, proprioception  to assist with  scapular, scapulothoracic and UE control with self care, reaching, carrying, lifting, house/yardwork, driving/computer work.    Therapeutic Activities:    []  [phone] or [phone]) Provided verbal/tactile cueing for activities related to improving balance, coordination, kinesthetic sense, posture, motor skill, proprioception and motor activation to allow for proper function of scapular, scapulothoracic and UE control with self care, carrying, lifting, driving/computer work.     Home Exercise Program:    [x]  [phone]) Reviewed/Progressed HEP activities related to strengthening, flexibility, endurance, ROM of scapular, scapulothoracic and UE control with self care, reaching, carrying, lifting, house/yardwork, driving/computer work  []  [phone]) Reviewed/Progressed HEP activities related to improving balance, coordination, kinesthetic sense, posture, motor  skill, proprioception of scapular, scapulothoracic and UE control with self care, reaching, carrying, lifting, house/yardwork, driving/computer work      Manual Treatments:  PROM / STM / Oscillations-Mobs:  G-I, II, III, IV (PA's, Inf., Post.)  [x]  (97140) Provided manual therapy to mobilize soft tissue/joints of cervical/CT, scapular GHJ and UE for the purpose of modulating pain, promoting relaxation,  increasing ROM, reducing/eliminating soft tissue swelling/inflammation/restriction, improving soft tissue extensibility and allowing for proper ROM for normal function with self care, reaching, carrying, lifting, house/yardwork, driving/computer work    Modalities:  Declined    Charges:  Timed Code Treatment Minutes: 42'    Total Treatment Minutes: 70'      []  EVAL (LOW) 97161 (typically 20 minutes face-to-face)  []  EVAL (MOD) 57846 (typically 30 minutes face-to-face)  []  EVAL (HIGH) 97163 (typically 45 minutes face-to-face)  []  RE-EVAL     [x]  NG(29528) x  1   []  IONTO  []  NMR (97112) x      []  VASO  [x]  Manual (97140) x  1    []  Other:  []  TA x       []  Mech Traction (41324)  []  ES(attended) (40102)      [x]  ES (un) (72536):     GOALS:  Patient stated goal: Return to recreational activities of golf and pilates   ??  Therapist goals for Patient:   Short Term Goals: To be achieved in: 2 weeks  1. Independent in HEP and progression per patient tolerance, in order to prevent re-injury.   2. Patient will have a decrease in pain to facilitate improvement in movement, function, and ADLs as indicated by Functional Deficits.  ??  Long Term Goals: To be achieved in: 12 weeks  1. Disability index score of 25% or less for the [facility] to assist with reaching prior level of function.   2. Patient will demonstrate increased AROM to 120 deg flexion to allow for proper joint functioning as indicated by patients Functional Deficits.   3. Patient will return to ADLs/ functional activities without increased symptoms or restriction.    4. Patient will return to recreational activities through safe progression and appropriate modifications.    ??  New or Updated Goals (if applicable):  [x]  No change to goals established upon initial eval/last progress note:  New Goals:    Progression Towards Functional goals:   []  Patient is progressing as expected towards functional goals listed.    []  Progression is slowed due to complexities listed.  []  Progression has been slowed due to co-morbidities.  [x]  Plan just implemented, too soon to assess goals progression  []  Other:     ASSESSMENT:    []  Improvement noted relative to goals:  []  No Improvement noted related to goals:  Summary/Patient's response to treatment: See Eval    Treatment/Activity Tolerance:  [x]  Patient tolerated treatment well []  Patient limited by fatique  []  Patient limited by pain  []  Patient limited by other medical complications  []  Other:     Prognosis: [x]  Good []  Fair  []  Poor    Patient Requires Follow-up: [x]  Yes  []  No    PLAN: [facility] - patient given phone number to call for scheduling   [x]  Continue per plan of care []  Alter current plan (see comments)  [x]  Plan of care initiated []  Hold pending MD visit []  Discharge    Electronically signed by: Susa Dayick Debria Broecker, PT [phone]

## 2015-12-26 ENCOUNTER — Inpatient Hospital Stay: Attending: Rehabilitative and Restorative Service Providers"

## 2015-12-26 NOTE — Other (Addendum)
[facility] ??? Orthopaedics and Sports Rehabilitation, [place]  [address]    [place], [place] [address]  Phone: [phone]   Fax:     [phone]    Physical Therapy Daily Treatment Note  Date:  12/26/2015    Patient Name:  Kenzlie Disch    DOB:  [birth year]  MRN: [id number]  Restrictions/Precautions:    Physician Information:  Referring Practitioner: Dr. Marilynne Drivers  Medical/Treatment Diagnosis Information:  ?? 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 Treatment Diagnosis:Left Shoulder Pain (M25.512) / Left shoulder stiffness (M25.612)    Conservative /  Surgical - DOS: 12/10/15  Therapy Diagnosis/Practice Pattern:  Practice Pattern H: Joint Arthroplasty  Insurance/Certification information:  PT Insurance Information: MEDICARE  Plan of care signed:  YES   NO  Number of Comorbidities:  0     1-2    3+  Date of Patient follow up with Physician: 02/03/16     G-Code (if applicable):      Date G-Code Applied:  08-Jan-2016  PT G-Codes  Functional Assessment Tool Used: Neldon Mc   Score: 61%  Functional Limitation: Carrying, moving and handling objects  Carrying, Moving and Handling Objects Current Status (N6295): At least 60 percent but less than 80 percent impaired, limited or restricted  Carrying, Moving and Handling Objects Goal Status [phone]): At least 20 percent but less than 40 percent impaired, limited or restricted    Progress Note:   Yes    No  Next due by: Visit #10        Latex Allergy:  NO      YES  Preferred Language for Healthcare:   English       other:    Visit # Insurance Allowable Reporting Period   3 MEDICARE Begin Date: 12/26/2015               End Date:      RECERT DUE BY: 03/12/16    Pain level:  4-6/10     SUBJECTIVE:  "i am good.. Still not sleeping obviously "     OBJECTIVE:  23 degrees ER @ 0   Observation:  Palpation:                                                 RESTRICTIONS/PRECAUTIONS: Reverse TSA  precautions : No ER > 30 deg, No IR resistance x 6 weeks     Exercises/Interventions:   Therapeutic Ex Sets/reps Notes   Table slide flexion 10 x     SBS 10 x     Shrugs 10 x     pendulum reviewed    Elbow AAROM 10 x     Self PROM ER stretch  10 x     Pulleys  3'                                                                Manual Intervention     PROM, joint osscillations  X 28 minutes NO ER> 30 deg, no IR resitance  NMR re-education                                                 Therapeutic Exercise and NMR EXR  [x]  [phone](97110) Provided verbal/tactile cueing for activities related to strengthening, flexibility, endurance, ROM  for improvements in scapular, scapulothoracic and UE control with self care, reaching, carrying, lifting, house/yardwork, driving/computer work.    []  [phone](97112) Provided verbal/tactile cueing for activities related to improving balance, coordination, kinesthetic sense, posture, motor skill, proprioception  to assist with  scapular, scapulothoracic and UE control with self care, reaching, carrying, lifting, house/yardwork, driving/computer work.    Therapeutic Activities:    []  [phone](97112 or [phone]) Provided verbal/tactile cueing for activities related to improving balance, coordination, kinesthetic sense, posture, motor skill, proprioception and motor activation to allow for proper function of scapular, scapulothoracic and UE control with self care, carrying, lifting, driving/computer work.     Home Exercise Program:    [x]  [phone](97110) Reviewed/Progressed HEP activities related to strengthening, flexibility, endurance, ROM of scapular, scapulothoracic and UE control with self care, reaching, carrying, lifting, house/yardwork, driving/computer work  []  (13086(97112) Reviewed/Progressed HEP activities related to improving balance, coordination, kinesthetic sense, posture, motor skill, proprioception of scapular, scapulothoracic and UE control with self care, reaching, carrying,  lifting, house/yardwork, driving/computer work      Manual Treatments:  PROM / STM / Oscillations-Mobs:  G-I, II, III, IV (PA's, Inf., Post.)  [x]  (97140) Provided manual therapy to mobilize soft tissue/joints of cervical/CT, scapular GHJ and UE for the purpose of modulating pain, promoting relaxation,  increasing ROM, reducing/eliminating soft tissue swelling/inflammation/restriction, improving soft tissue extensibility and allowing for proper ROM for normal function with self care, reaching, carrying, lifting, house/yardwork, driving/computer work    Modalities:  Declined    Charges:  Timed Code Treatment Minutes: 46    Total Treatment Minutes: 65'      []  EVAL (LOW) 97161 (typically 20 minutes face-to-face)  []  EVAL (MOD) 5784697162 (typically 30 minutes face-to-face)  []  EVAL (HIGH) 97163 (typically 45 minutes face-to-face)  []  RE-EVAL     [x]  NG(29528TE(97110) x  1   []  IONTO  []  NMR (97112) x      []  VASO  [x]  Manual (97140) x  2    []  Other:  []  TA x       []  Mech Traction (41324(97012)  []  ES(attended) (40102(97032)      [x]  ES (un) (72536(97014):     GOALS:  Patient stated goal: Return to recreational activities of golf and pilates   ??  Therapist goals for Patient:   Short Term Goals: To be achieved in: 2 weeks  1. Independent in HEP and progression per patient tolerance, in order to prevent re-injury.   2. Patient will have a decrease in pain to facilitate improvement in movement, function, and ADLs as indicated by Functional Deficits.  ??  Long Term Goals: To be achieved in: 12 weeks  1. Disability index score [facility] 25[facility] or less [facility] to assist with reaching prior level of function.   2. Patient will demonstrate increased AROM to 120 deg flexion to allow for proper joint functioning as indicated by patients Functional Deficits.   3. Patient will return to ADLs/ functional activities without increased symptoms or restriction.   4. Patient will return to recreational activities through safe progression and appropriate  modifications.    ??  New or Updated Goals (if applicable):  [x]  No change to goals established upon initial eval/last progress note:  New Goals:    Progression Towards Functional goals:   []  Patient is progressing as expected towards functional goals listed.    []  Progression is slowed due to complexities listed.  []  Progression has been slowed due to co-morbidities.  [x]  Plan just implemented, too soon to assess goals progression  []  Other:     ASSESSMENT:    []  Improvement noted relative to goals:  []  No Improvement noted related to goals:  Summary/Patient's response to treatment: See Eval    Treatment/Activity Tolerance:  [x]  Patient tolerated treatment well []  Patient limited by fatique  []  Patient limited by pain  []  Patient limited by other medical complications  []  Other:     Prognosis: [x]  Good []  Fair  []  Poor    Patient Requires Follow-up: [x]  Yes  []  No    PLAN: Patient requested to follow up at [facility] location - patient given phone number to call for scheduling   [x]  Continue per plan of care []  Alter current plan (see comments)  []  Plan of care initiated []  Hold pending MD visit []  Discharge    Electronically signed by: Susa Day, PT [phone]

## 2015-12-30 ENCOUNTER — Inpatient Hospital Stay: Attending: Rehabilitative and Restorative Service Providers"

## 2015-12-30 NOTE — Other (Signed)
The [facility] ??? Orthopaedics and Sports Rehabilitation, [place]  [address], [address]    [place], [place] [address]  Phone: [phone]   Fax:     [phone]  Physical Therapy Daily Treatment Note  Date:  12/30/2015    Patient Name:  Catherine Roth    DOB:  08-Oct-1945  MRN: [id number]  Restrictions/Precautions:    Physician Information:  Referring Practitioner: Dr. Marilynne Drivers  Medical/Treatment Diagnosis Information:  ?? 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 Treatment Diagnosis:Left Shoulder Pain (M25.512) / Left shoulder stiffness (M25.612)   []  Conservative / [x]  Surgical - DOS: 12/10/15  Therapy Diagnosis/Practice Pattern:  Practice Pattern H: Joint Arthroplasty  Insurance/Certification information:  PT Insurance Information: MEDICARE  Plan of care signed: []  YES  [x]  NO  Number of Comorbidities:  [] 0     [x] 1-2    [] 3+  Date of Patient follow up with Physician: 02/03/16     G-Code (if applicable):      Date G-Code Applied:  17-Jan-2016  PT G-Codes  Functional Assessment Tool Used: Neldon Mc   Score: 61%  Functional Limitation: Carrying, moving and handling objects  Carrying, Moving and Handling Objects Current Status (N6295): At least 60 percent but less than 80 percent impaired, limited or restricted  Carrying, Moving and Handling Objects Goal Status [phone]): At least 20 percent but less than 40 percent impaired, limited or restricted    Progress Note: [x]   Yes  []   No  Next due by: Visit #10        Latex Allergy:  [x] NO      [] YES  Preferred Language for Healthcare:   [x] English       [] other:    Visit # Insurance Allowable Reporting Period   4 MEDICARE Begin Date: 12/30/2015               End Date:      RECERT DUE BY: 03/12/16    Pain level:  2-3/10     SUBJECTIVE:  "I am doing pretty well so far"      OBJECTIVE: 30 degrees ER @ 0   Observation:  Palpation:                                                 RESTRICTIONS/PRECAUTIONS: Reverse TSA precautions :  No ER > 30 deg, No IR resistance x 6 weeks     Exercises/Interventions:   Therapeutic Ex Sets/reps Notes   Table slide flexion 10 x     SBS 10 x     Shrugs 10 x     pendulum reviewed    Elbow AAROM 10 x     Self PROM ER stretch  10 x     Pulleys  3'                                                                Manual Intervention     PROM, joint osscillations  X 28 minutes NO ER> 30 deg, no IR resitance  NMR re-education                                                 Therapeutic Exercise and NMR EXR  [x]  [phone](97110) Provided verbal/tactile cueing for activities related to strengthening, flexibility, endurance, ROM  for improvements in scapular, scapulothoracic and UE control with self care, reaching, carrying, lifting, house/yardwork, driving/computer work.    []  [phone](97112) Provided verbal/tactile cueing for activities related to improving balance, coordination, kinesthetic sense, posture, motor skill, proprioception  to assist with  scapular, scapulothoracic and UE control with self care, reaching, carrying, lifting, house/yardwork, driving/computer work.    Therapeutic Activities:    []  [phone](97112 or [phone]) Provided verbal/tactile cueing for activities related to improving balance, coordination, kinesthetic sense, posture, motor skill, proprioception and motor activation to allow for proper function of scapular, scapulothoracic and UE control with self care, carrying, lifting, driving/computer work.     Home Exercise Program:    [x]  [phone](97110) Reviewed/Progressed HEP activities related to strengthening, flexibility, endurance, ROM of scapular, scapulothoracic and UE control with self care, reaching, carrying, lifting, house/yardwork, driving/computer work  []  (53664(97112) Reviewed/Progressed HEP activities related to improving balance, coordination, kinesthetic sense, posture, motor skill, proprioception of scapular, scapulothoracic and UE control with self care, reaching, carrying, lifting,  house/yardwork, driving/computer work      Manual Treatments:  PROM / STM / Oscillations-Mobs:  G-I, II, III, IV (PA's, Inf., Post.)  [x]  (97140) Provided manual therapy to mobilize soft tissue/joints of cervical/CT, scapular GHJ and UE for the purpose of modulating pain, promoting relaxation,  increasing ROM, reducing/eliminating soft tissue swelling/inflammation/restriction, improving soft tissue extensibility and allowing for proper ROM for normal function with self care, reaching, carrying, lifting, house/yardwork, driving/computer work    Modalities: egs     Charges:  Timed Code Treatment Minutes: 46    Total Treatment Minutes: 65'      []  EVAL (LOW) 97161 (typically 20 minutes face-to-face)  []  EVAL (MOD) 4034797162 (typically 30 minutes face-to-face)  []  EVAL (HIGH) 97163 (typically 45 minutes face-to-face)  []  RE-EVAL     [x]  QQ(59563TE(97110) x  1   []  IONTO  []  NMR (97112) x      []  VASO  [x]  Manual (97140) x  1    []  Other:  []  TA x       []  Mech Traction (87564(97012)  []  ES(attended) (33295(97032)      [x]  ES (un) (18841(97014):     GOALS:  Patient stated goal: Return to recreational activities of golf and pilates   ??  Therapist goals for Patient:   Short Term Goals: To be achieved in: 2 weeks  1. Independent in HEP and progression per patient tolerance, in order to prevent re-injury.   2. Patient will have a decrease in pain to facilitate improvement in movement, function, and ADLs as indicated by Functional Deficits.  ??  Long Term Goals: To be achieved in: 12 weeks  1. Disability index score of [facility] or less [facility] to assist with reaching prior level of function.   2. Patient will demonstrate increased AROM to 120 deg flexion to allow for proper joint functioning as indicated by patients Functional Deficits.   3. Patient will return to ADLs/ functional activities without increased symptoms or restriction.   4. Patient will return to recreational activities through safe progression and appropriate modifications.    ??  New  or Updated Goals (if applicable):   No change to goals established upon initial eval/last progress note:  New Goals:    Progression Towards Functional goals:    Patient is progressing as expected towards functional goals listed.     Progression is slowed due to complexities listed.   Progression has been slowed due to co-morbidities.   Plan just implemented, too soon to assess goals progression   Other:     ASSESSMENT:     Improvement noted relative to goals:   No Improvement noted related to goals:  Summary/Patient's response to treatment: See Eval    Treatment/Activity Tolerance:   Patient tolerated treatment well  Patient limited by fatique   Patient limited by pain   Patient limited by other medical complications   Other:     Prognosis:  Good  Fair   Poor    Patient Requires Follow-up:  Yes   No    PLAN: Patient requested to follow up at [facility] location - patient given phone number to call for scheduling    Continue per plan of care  Alter current plan (see comments)   Plan of care initiated  Hold pending MD visit  Discharge    Electronically signed by: Susa Day, PT [phone]

## 2016-01-02 ENCOUNTER — Inpatient Hospital Stay: Attending: Rehabilitative and Restorative Service Providers"

## 2016-01-02 NOTE — Other (Addendum)
[facility] ??? Orthopaedics and Sports Rehabilitation, [place]  [address], [address]    [place], [place] [id number]  Phone: [phone]   Fax:     [phone]  Physical Therapy Daily Treatment Note  Date:  01/02/2016    Patient Name:  Catherine HeadingCarol Roth    DOB:  04/16/1946  MRN: [id number]  Restrictions/Precautions:    Physician Information:  Referring Practitioner: Dr. Marilynne DriversFavorito  Medical/Treatment Diagnosis Information:  ?? 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 Treatment Diagnosis:Left Shoulder Pain (M25.512) / Left shoulder stiffness (M25.612)   []  Conservative / [x]  Surgical - DOS: 12/10/15  Therapy Diagnosis/Practice Pattern:  Practice Pattern H: Joint Arthroplasty  Insurance/Certification information:  PT Insurance Information: MEDICARE  Plan of care signed: []  YES  [x]  NO  Number of Comorbidities:  [] 0     [x] 1-2    [] 3+  Date of Patient follow up with Physician: 02/03/16     G-Code (if applicable):      Date G-Code Applied:  12/19/15  PT G-Codes  Functional Assessment Tool Used: Neldon McQuick Dash   Score: 61%  Functional Limitation: Carrying, moving and handling objects  Carrying, Moving and Handling Objects Current Status (N6295(G8984): At least 60 percent but less than 80 percent impaired, limited or restricted  Carrying, Moving and Handling Objects Goal Status [phone](G8985): At least 20 percent but less than 40 percent impaired, limited or restricted    Progress Note: [x]   Yes  []   No  Next due by: Visit #10        Latex Allergy:  [x] NO      [] YES  Preferred Language for Healthcare:   [x] English       [] other:    Visit # Insurance Allowable Reporting Period   5 MEDICARE Begin Date: 01/02/2016               End Date:      RECERT DUE BY: 03/12/16    Pain level:  2-3/10     SUBJECTIVE:  "I feel like my shoulder is moving pretty well"      OBJECTIVE: 30 degrees ER @ 0   Observation:  Palpation:                                                 RESTRICTIONS/PRECAUTIONS: Reverse TSA  precautions : No ER > 30 deg, No IR resistance x 6 weeks     Exercises/Interventions:   Therapeutic Ex Sets/reps Notes   Table slide flexion 10 x     SBS 10 x     Shrugs 10 x  #1   pendulum reviewed          Pulleys  3'    Flex/ext isos 10x 10"    Alphabet r/s on wall with ball 3x through    Walk climbs 10x10"    Scapular pnf 30x Down and back   SA supine punches 30x #1                                      Manual Intervention     PROM, joint osscillations  X 12 minutes NO ER> 30 deg, no IR resitance  Therapeutic Exercise and NMR EXR   (97110) Provided verbal/tactile cueing for activities related to strengthening, flexibility, endurance, ROM  for improvements in scapular, scapulothoracic and UE control with self care, reaching, carrying, lifting, house/yardwork, driving/computer work.     [phone]) Provided verbal/tactile cueing for activities related to improving balance, coordination, kinesthetic sense, posture, motor skill, proprioception  to assist with  scapular, scapulothoracic and UE control with self care, reaching, carrying, lifting, house/yardwork, driving/computer work.    Therapeutic Activities:     [phone] or 09811) Provided verbal/tactile cueing for activities related to improving balance, coordination, kinesthetic sense, posture, motor skill, proprioception and motor activation to allow for proper function of scapular, scapulothoracic and UE control with self care, carrying, lifting, driving/computer work.     Home Exercise Program:     [phone]) Reviewed/Progressed HEP activities related to strengthening, flexibility, endurance, ROM of scapular, scapulothoracic and UE control with self care, reaching, carrying, lifting, house/yardwork, driving/computer work   (29562) Reviewed/Progressed HEP activities related to improving balance, coordination, kinesthetic sense, posture, motor skill, proprioception of scapular,  scapulothoracic and UE control with self care, reaching, carrying, lifting, house/yardwork, driving/computer work      Manual Treatments:  PROM / STM / Oscillations-Mobs:  G-I, II, III, IV (PA's, Inf., Post.)   (97140) Provided manual therapy to mobilize soft tissue/joints of cervical/CT, scapular GHJ and UE for the purpose of modulating pain, promoting relaxation,  increasing ROM, reducing/eliminating soft tissue swelling/inflammation/restriction, improving soft tissue extensibility and allowing for proper ROM for normal function with self care, reaching, carrying, lifting, house/yardwork, driving/computer work    Modalities: heat 10', ice 15,    Charges:  Timed Code Treatment Minutes: 50'    Total Treatment Minutes: 74'       EVAL (LOW) 97161 (typically 20 minutes face-to-face)   EVAL (MOD) 13086 (typically 30 minutes face-to-face)   EVAL (HIGH) 97163 (typically 45 minutes face-to-face)   RE-EVAL      VH(84696) x  2    IONTO   NMR (97112) x       VASO   Manual (97140) x  1     Other:   TA x        Mech Traction (29528)   ES(attended) (41324)       ES (un) (40102):     GOALS:  Patient stated goal: Return to recreational activities of golf and pilates   ??  Therapist goals for Patient:   Short Term Goals: To be achieved in: 2 weeks  1. Independent in HEP and progression per patient tolerance, in order to prevent re-injury.   2. Patient will have a decrease in pain to facilitate improvement in movement, function, and ADLs as indicated by Functional Deficits.  ??  Long Term Goals: To be achieved in: 12 weeks  1. Disability index score of 25% or less for the [facility] to assist with reaching prior level of function.   2. Patient will demonstrate increased AROM to 120 deg flexion to allow for proper joint functioning as indicated by patients Functional Deficits.   3. Patient will return to ADLs/ functional activities without increased symptoms or restriction.   4. Patient will return to  recreational activities through safe progression and appropriate modifications.    ??    New or Updated Goals (if applicable):   No change to goals established upon initial eval/last progress note:  New Goals:    Progression Towards Functional goals:    Patient is progressing as expected towards functional goals listed.      Progression is slowed due to complexities listed.  []  Progression has been slowed due to co-morbidities.  []  Plan just implemented, too soon to assess goals progression  []  Other:     ASSESSMENT:    []  Improvement noted relative to goals:  []  No Improvement noted related to goals:  Summary/Patient's response to treatment: See Eval    Treatment/Activity Tolerance:  [x]  Patient tolerated treatment well []  Patient limited by fatique  []  Patient limited by pain  []  Patient limited by other medical complications  []  Other:     Prognosis: [x]  Good []  Fair  []  Poor    Patient Requires Follow-up: [x]  Yes  []  No    PLAN: Patient requested to follow up at [facility] location - patient given phone number to call for scheduling   [x]  Continue per plan of care []  Alter current plan (see comments)  []  Plan of care initiated []  Hold pending MD visit []  Discharge    Electronically signed ZO:[id number] Iding SPT  Therapist was present, directed the patient's care, made skilled judgement and was responsible for assessment and treatment of the patient     Susa Day, PT [phone]

## 2016-01-05 ENCOUNTER — Encounter: Attending: Rehabilitative and Restorative Service Providers"

## 2016-01-07 ENCOUNTER — Inpatient Hospital Stay: Attending: Rehabilitative and Restorative Service Providers"

## 2016-01-12 ENCOUNTER — Ambulatory Visit: Admit: 2016-01-12 | Discharge: 2016-01-12 | Payer: MEDICARE | Attending: Orthopaedic Surgery

## 2016-01-12 DIAGNOSIS — M19019 Primary osteoarthritis, unspecified shoulder: Secondary | ICD-10-CM

## 2016-01-12 NOTE — Progress Notes (Signed)
HISTORY OF PRESENT ILLNESS: The patient returns today for the second postoperative visit after left reverse total shoulder arthroplasty.  Home exercises and physical therapy have been performed as instructed.    SANE: 70 %    PHYSICAL EXAMINATION: Inspection of the affected left shoulder reveals that the incision is healed.  The skin is warm. The deltoid contracts.  The distal neurovascular exam is grossly intact.  Range of motion reveals 130?? of active forward elevation.  She can position her hand behind her head.  She has 4+ out of 5 forward elevation and external rotation strength    X-RAYS: Not obtained    ASSESSMENT/PLAN: Doing well after left reverse total shoulder arthroplasty. The patient will continue with formal physical therapy and home exercises.  She'll [place] leaving for [place] most of the summer.  Catherine RegalCarol understands her restrictions and I'll reevaluate in in 2 months and repeat true AP and x-rays of the shoulder.

## 2016-01-13 ENCOUNTER — Inpatient Hospital Stay: Attending: Rehabilitative and Restorative Service Providers"

## 2016-01-14 ENCOUNTER — Inpatient Hospital Stay: Attending: Rehabilitative and Restorative Service Providers"

## 2016-01-14 NOTE — Other (Signed)
[facility] ??? Orthopaedics and Sports Rehabilitation, [place]  [address], [address]    [place], [place] [address]  Phone: [phone]   Fax:     [phone]  Physical Therapy Daily Treatment Note  Date:  01/14/2016   Patient Name:  Catherine Roth    DOB:  02-Oct-1945  MRN: [id number]  Restrictions/Precautions:    Physician Information:  Referring Practitioner: Dr. Marilynne Drivers  Medical/Treatment Diagnosis Information:  ?? 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 Treatment Diagnosis:Left Shoulder Pain (M25.512) / Left shoulder stiffness (M25.612)    Conservative /  Surgical - DOS: 12/10/15  Therapy Diagnosis/Practice Pattern:  Practice Pattern H: Joint Arthroplasty  Insurance/Certification information:  PT Insurance Information: MEDICARE  Plan of care signed:  YES   NO  Number of Comorbidities:  0     1-2    3+  Date of Patient follow up with Physician: 02/03/16     G-Code (if applicable):      Date G-Code Applied:  January 09, 2016  PT G-Codes  Functional Assessment Tool Used: Neldon Mc   Score: 61%  Functional Limitation: Carrying, moving and handling objects  Carrying, Moving and Handling Objects Current Status (N6295): At least 60 percent but less than 80 percent impaired, limited or restricted  Carrying, Moving and Handling Objects Goal Status [phone]): At least 20 percent but less than 40 percent impaired, limited or restricted    Progress Note:   Yes    No  Next due by: Visit #10        Latex Allergy:  NO      YES  Preferred Language for Healthcare:   English       other:    Visit # Insurance Allowable Reporting Period   6 MEDICARE Begin Date: 01/15/2016               End Date:      RECERT DUE BY: 03/12/16    Pain level:  2-3/10     SUBJECTIVE:  "I am fine.  Going to [place] for as long as I want"      OBJECTIVE: full rom within allowed guidelines  Observation:  Palpation:                                                  RESTRICTIONS/PRECAUTIONS: Reverse TSA precautions : No ER > 30 deg, No IR resistance x 6 weeks     Exercises/Interventions:   Therapeutic Ex Sets/reps Notes        SBS 10 x     Shrugs 20 x  #1   pendulum reviewed          Pulleys  3'    Flex/ext isos 10x 10"    Alphabet r/s on wall with ball 3x through    Walk climbs 10x10"    Scapular pnf 30x Down and back   SA supine punches 30x #1   Supine flexion  30x 1#    Therabands: rows, ext 30x red, yellow - 30x                              Manual Intervention     PROM, joint osscillations  X 23' minutes NO ER> 30 deg, no IR resitance  Therapeutic Exercise and NMR EXR  [x]  [phone](97110) Provided verbal/tactile cueing for activities related to strengthening, flexibility, endurance, ROM  for improvements in scapular, scapulothoracic and UE control with self care, reaching, carrying, lifting, house/yardwork, driving/computer work.    []  [phone](97112) Provided verbal/tactile cueing for activities related to improving balance, coordination, kinesthetic sense, posture, motor skill, proprioception  to assist with  scapular, scapulothoracic and UE control with self care, reaching, carrying, lifting, house/yardwork, driving/computer work.    Therapeutic Activities:    []  [phone](97112 or [phone]) Provided verbal/tactile cueing for activities related to improving balance, coordination, kinesthetic sense, posture, motor skill, proprioception and motor activation to allow for proper function of scapular, scapulothoracic and UE control with self care, carrying, lifting, driving/computer work.     Home Exercise Program:    [x]  [phone](97110) Reviewed/Progressed HEP activities related to strengthening, flexibility, endurance, ROM of scapular, scapulothoracic and UE control with self care, reaching, carrying, lifting, house/yardwork, driving/computer work  []  [phone](97112) Reviewed/Progressed HEP activities related to improving balance,  coordination, kinesthetic sense, posture, motor skill, proprioception of scapular, scapulothoracic and UE control with self care, reaching, carrying, lifting, house/yardwork, driving/computer work      Manual Treatments:  PROM / STM / Oscillations-Mobs:  G-I, II, III, IV (PA's, Inf., Post.)  [x]  (97140) Provided manual therapy to mobilize soft tissue/joints of cervical/CT, scapular GHJ and UE for the purpose of modulating pain, promoting relaxation,  increasing ROM, reducing/eliminating soft tissue swelling/inflammation/restriction, improving soft tissue extensibility and allowing for proper ROM for normal function with self care, reaching, carrying, lifting, house/yardwork, driving/computer work    Modalities: heat 10', ice 15, egs     Charges:  Timed Code Treatment Minutes: 6252'    Total Treatment Minutes: 474'      []  EVAL (LOW) 97161 (typically 20 minutes face-to-face)  []  EVAL (MOD) [id number] (typically 30 minutes face-to-face)  []  EVAL (HIGH) 97163 (typically 45 minutes face-to-face)  []  RE-EVAL     [x]  NG(29528TE(97110) x  1   []  IONTO  []  NMR (97112) x      []  VASO  [x]  Manual (97140) x  2    []  Other:  []  TA x       []  Mech Traction (41324(97012)  []  ES(attended) (40102(97032)      [x]  ES (un) (72536(97014):     GOALS:  Patient stated goal: Return to recreational activities of golf and pilates   ??  Therapist goals for Patient:   Short Term Goals: To be achieved in: 2 weeks  1. Independent in HEP and progression per patient tolerance, in order to prevent re-injury.   2. Patient will have a decrease in pain to facilitate improvement in movement, function, and ADLs as indicated by Functional Deficits.  ??  Long Term Goals: To be achieved in: 12 weeks  1. Disability index score of 25% or less for the [facility] to assist with reaching prior level of function.   2. Patient will demonstrate increased AROM to 120 deg flexion to allow for proper joint functioning as indicated by patients Functional Deficits.   3. Patient will return to ADLs/  functional activities without increased symptoms or restriction.   4. Patient will return to recreational activities through safe progression and appropriate modifications.    ??    New or Updated Goals (if applicable):  [x]  No change to goals established upon initial eval/last progress note:  New Goals:    Progression Towards Functional goals:   [x]  Patient is progressing as expected towards functional goals  listed.    []  Progression is slowed due to complexities listed.  []  Progression has been slowed due to co-morbidities.  []  Plan just implemented, too soon to assess goals progression  []  Other:     ASSESSMENT:    []  Improvement noted relative to goals:  []  No Improvement noted related to goals:  Summary/Patient's response to treatment: See Eval    Treatment/Activity Tolerance:  [x]  Patient tolerated treatment well []  Patient limited by fatique  []  Patient limited by pain  []  Patient limited by other medical complications  []  Other:     Prognosis: [x]  Good []  Fair  []  Poor    Patient Requires Follow-up: [x]  Yes  []  No    PLAN: [facility] - patient given phone number to call for scheduling   [x]  Continue per plan of care []  Alter current plan (see comments)  []  Plan of care initiated []  Hold pending MD visit []  Discharge    Electronically signed by:     Susa Dayick Ixchel Duck, PT

## 2016-01-16 ENCOUNTER — Encounter: Attending: Rehabilitative and Restorative Service Providers"

## 2016-02-03 ENCOUNTER — Ambulatory Visit: Payer: MEDICARE

## 2016-02-03 ENCOUNTER — Encounter: Payer: MEDICARE | Attending: Orthopaedic Surgery

## 2016-02-03 ENCOUNTER — Encounter: Attending: Rehabilitative and Restorative Service Providers"

## 2016-02-04 NOTE — Unmapped (Signed)
OP Treatment Plan    Nicole Odonnell  [id number]    [facility] TREATMENT PLAN:   General Goals:  Reduce risk of symptomatic/syndromic/functional relapse or recurrence. Patient will remain psychiatrically stable by reducing or stabilizing his/her signs and symptoms of mental illness and maximizing his/her level of independence. Patient will be able to recognize, accept, and manage his/her mental health and/or substance misuse disorders, including working with the medical staff to manage his/her medications. Improve symptoms/maintain symptom improvement. Improve functioning/maintain functional improvement. Patient will report stabilization of body weight and image.  Med/Som Specific:  Patient will report all signs and symptoms to the MD/NP. Patient will attend all scheduled appointments with MD/NP. Patient will engage in an informed consent discussion regarding medications for the treatment of their symptoms. Patient will demonstrate adherence to the medication treatment that is established. Patient will report any side effect issues related to the medications. Patient will agree to have MD/NP collaborate with other health professional as needed to ensure optimal care.  Therapy Specific:  Patient will attend and be an active participant in individual therapy.  Expected Frequency of Visits:  2x/year/Ongoing  Prognosis:  Good

## 2016-02-10 NOTE — Progress Notes (Signed)
This encounter was created in error - please disregard.

## 2016-03-03 ENCOUNTER — Ambulatory Visit: Admit: 2016-03-03 | Payer: MEDICARE

## 2016-03-03 DIAGNOSIS — F5081 Binge eating disorder: Secondary | ICD-10-CM

## 2016-03-03 NOTE — Unmapped (Addendum)
Pt here for visit. She remains on Wellbutrin XL 300 mg/d, Prozac 60 mg/d, and lorazepam .5 mg qHS.  She stopped lithium in June after she had tremor and ataxia. The tremor and ataxia were much improved the next day but she still has intermittent tremor and balance problems, for which she is having a neurological evaluation with Dr. Izora Ribas.  She gets depressed and binges whenever she gets tired, lonely, or her schedule is disrupted (as when her grandchildren visit). She has had no hypomania or anxiety. The lorazepam continues to be very helpful for sleep. Her energy, focus, and concentration are good. She is functioning well and continues to have great relationships with her children and grandchildren.  She thinks the binge eating, when it occurs, makes her depressed.     Time: 45 minutes; Psychotherapy 25 minutes     Psychotherapy: Discussed options for her BED and decided she would like to try Victoza. Pt educated about the side effects of Victoza and shown a video on how to give herself the injections.    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  She had her left shoulder replaced without difficulties. She has no side effects except for mild tremor (which is not apparent today) and mild cognitive impairment - she has difficulty recalling details about her medical history.  She remains on Vitamin D 2000 units/day. Weight is stable. She conts to exercise regularly.    Labs:  03/03/16 wt=128.0  02/26/15 wt=126 lbs  07/05/14 wt=129.4 lbs  06/28/14 Li=.2, TSH=.88, T4=5.18    Assessment and Management Plan (Prescribers):   1. dysregulated eating behavior -  Cont Wellbutrin XL 300 mg/d,  fluoxetine 60 mg/day;  Add Victoza - .6 to 1.8 mg/d,  get repeat labs in 10/16 when she has her annual physical  2.  depressive symptoms - cont Wellbutrin XL and fluoxetine,  cont Vitamin D, get fluoxetine level with her annual labs.  3. insomnia-cont lorazepam .5 mg every night; cont to stress good sleep hygiene; again warned of side effects of sedation, confusion, and impaired driving  4. impaired attention and focus- Cont Wellbutrin XL 300 mg/d   5. Possible excessive worrying-Cont Wellbutrin XL 300 mg/day  6. mild obsessive compulsive symptoms (nail biting) - cont to monitor  7. Ongoing grief issues related to the death of her husband-cont psychotherapy  8. 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 Will hold lithium for now.

## 2016-03-12 ENCOUNTER — Ambulatory Visit: Admit: 2016-03-12 | Discharge: 2016-03-12 | Payer: MEDICARE | Attending: Orthopaedic Surgery

## 2016-03-12 ENCOUNTER — Ambulatory Visit: Admit: 2016-03-12 | Payer: MEDICARE

## 2016-03-12 DIAGNOSIS — Z96612 Presence of left artificial shoulder joint: Secondary | ICD-10-CM

## 2016-03-12 NOTE — Progress Notes (Signed)
HISTORY OF PRESENT ILLNESS: The patient returns today 3.5 months after left reverse total shoulder arthroplasty.  She has attended physical therapy and perform home exercises as instructed.  Motion and strength are improved.    REVIEW OF SYSTEMS: Pertinent items are noted in the HPI.  Review of symptoms reviewed from the Patient History Form and dated on 11/04/15 are available in the patient's chart under the Media tab.    PHYSICAL EXAMINATION: Inspection of the affected left shoulder reveals a healed incision. The skin is warm. The deltoid contracts nicely.  The distal neurovascular exam is grossly intact.  Range of motion reveals    Active Forward Elevation 160  Passive Forward Elevation 160  Active Abduction 100  Passive Abduction 105  Active External Rotation with arm at the side 45  Passive External Rotation with arm at the side 50  Active External Rotation with arm in full abduction 90  Passive External Rotation with arm in full abduction 90  Active Internal Rotation with arm in full abduction 0  Passive Internal Rotation with arm in full abduction 0  Internal Rotation - L5  ??  Strength testing reveals  Forward elevation 5-/5  External Rotation 5-/5    Examination of the contralateral shoulder reveals no atrophy or deformity. The skin is warm and dry. Range of motion is within normal limits. There is no focal tenderness with palpation. Provocative SLAP, biceps tension, apprehension AC joint or rotator cuff tests are negative. Strength is graded 5/5 in all muscle groups. The distal neurovascular exam is grossly intact.    Cervical spine: The skin is warm and dry. There is no swelling, warmth, or erythema. Range of motion is within normal limits. There is no paraspinal or spinous process tenderness. Spurling's sign is negative and did not produce shoulder pain. The distal neurovascular exam is grossly intact.    X-RAYS:  True AP and axillary views of the left shoulder reveal appropriately placed, well located  reverse total shoulder arthroplasty components.      ASSESSMENT/PLAN: Doing well after reverse total shoulder arthroplasty.  The patient will continue with home exercises. Okey RegalCarol will follow up in approximately 9 months for repeat evaluation.  At that time I will repeat true AP and axillary views of the shoulder.

## 2016-03-12 NOTE — Patient Instructions (Signed)
I have reviewed the provider's instructions with the patient, answering all questions to her satisfaction.

## 2016-07-22 NOTE — Unmapped (Signed)
Patient had pharmacy contact the office requesting an early refill of her lorazepam. I spoke with patient who said she lost 20 pills. She denied ever taking more than 1 pill/day and said there were days when she took none. She denied any misuse of lorazepam. [facility] shows that I am the only lorazepam prescriber. She remains on Trulicity, which has significantly reduced her binge eating. Her weight is stable. I told her I would not fill another lorazepam script until she had a visit with me. She is going to [facility] tomorrow for 3 months and has an appt with me the first week of April.

## 2016-10-19 ENCOUNTER — Ambulatory Visit: Admit: 2016-10-19 | Payer: MEDICARE

## 2016-10-19 DIAGNOSIS — F5081 Binge eating disorder: Secondary | ICD-10-CM

## 2016-10-19 MED ORDER — FLUoxetine (PROZAC) 20 MG tablet
20 | ORAL_TABLET | Freq: Every day | ORAL | 1 refills | Status: AC
Start: 2016-10-19 — End: ?

## 2016-10-19 NOTE — Plan of Care (Signed)
OP Treatment Plan    JKAYLA MCCALLON  [id number]    [facility] TREATMENT PLAN:   General Goals:  Reduce risk of symptomatic/syndromic/functional relapse or recurrence. Patient will remain psychiatrically stable by reducing or stabilizing his/her signs and symptoms of mental illness and maximizing his/her level of independence. Patient will be able to recognize, accept, and manage his/her mental health and/or substance misuse disorders, including working with the medical staff to manage his/her medications. Improve symptoms/maintain symptom improvement. Improve functioning/maintain functional improvement. Patient will verbalize insight into the need and importance of receiving care for their symptoms. Patient will report stabilization of body weight and image.  Med/Som Specific:  Patient will report all signs and symptoms to the MD/NP. Patient will attend all scheduled appointments with MD/NP. Patient will engage in an informed consent discussion regarding medications for the treatment of their symptoms. Patient will demonstrate adherence to the medication treatment that is established. Patient will report any side effect issues related to the medications. Patient will agree to have MD/NP collaborate with other health professional as needed to ensure optimal care.  Therapy Specific:  Patient will attend and be an active participant in individual therapy. Patient will address psychosocial stresses that impact development of symptoms in therapy. Patient will demonstrate increased knowledge and implementation of coping strategies to reduce symptoms and improve functioning.  Expected Frequency of Visits:  2x/year/Ongoing  Prognosis:  Good

## 2016-10-19 NOTE — Unmapped (Signed)
OP Treatment Plan    Nicole Odonnell  [id number]    [facility] TREATMENT PLAN

## 2016-10-19 NOTE — Unmapped (Signed)
Patient had pharmacy contact the office requesting an early refill of her lorazepam. I spoke with patient who said she lost 20 pills. She denied ever taking more than 1 pill/day and said there were days when she took none. She denied any misuse of lorazepam. [facility] shows that I am the only lorazepam prescriber. She remains on Trulicity, which has significantly reduced her binge eating. Her weight is stable. I told her I would not fill another lorazepam script until she had a visit with me. She is going to [facility] tomorrow for 3 months and has an appt with me the first week of April.

## 2016-10-19 NOTE — Unmapped (Addendum)
Pt here for visit. She remains on Wellbutrin XL 300 mg/d, Trulicity 1.5 mg weekly, lorazepam .5 mg qHS, and has reduced the Prozac to 40 mg/d (on 60 mg/d she was getting nausea). He mood is good 85% of the time with depression the other 15 % of the time, which responds well to exercise.  A man she dates says she gets a little hypomanic at times (she'll laugh a lot), but she feels it causes no problems. Since stopping lithium in June she has had no tremor or ataxia. Her neurologist said he thinks her tremor and ataxia were due to the lithium.  Her binge eating has responded very well to the Trulicity though she still binges when under stress. She has had minimal anxiety. The lorazepam continues to be very helpful for sleep; she tried to ween herself off it but was unable to sleep. In the past tear she has taken 1 mg at night to get to sleep on 2 occasions. Her OARRS was reviewed and there is no indication of drug misuse. Her energy, focus, and concentration are good. She is functioning well and continues to have great relationships with her children and grandchildren.      Time: 44 minutes; Psychotherapy 30 minutes     Psychotherapy: Discussed her desire to continue Trulicity for the BED. Discussed that she misses Lloyd Huger. She also wants some independence from her current boy friend.    MSE: Neatly dressed; good eye contact; alert, Ox3; normal mood and affect, normal speech, no dels or hals; memory grossly intact; no SI or HI; insight fair, judgement good    ROS: She remains in good medical health except for some calcification in her heart, which is aynptomatic. She has no side effects from the Trulicity, has no difficulty giving herself injections, and is tolerating the bupropion, Prozac 40 mg/d, and lorazepam well. She has no nausea on Prozac 40 mg/d. She has no abnormal movements, including no tremor.  Weight is stable. She conts to exercise regularly. There has been no purging or excessive exercise. She will soon  have her annual physical exam.    Labs:  10/29/16 wt=118.2 lbs  03/03/16 wt=128.0  02/26/15 wt=126 lbs  07/05/14 wt=129.4 lbs  06/28/14 Li=.2, TSH=.88, T4=5.18    Diagnoses:  Binge eating disorder  Bipolar II disorder  ADD  Insomnia    Assessment and Management Plan (Prescribers):   1. dysregulated eating behavior -  Cont Wellbutrin XL 300 mg/d,  fluoxetine 40 mg/day, trulicity 1.5 mg/week  2. depressive symptoms - cont Wellbutrin XL and fluoxetine  3. insomnia-cont lorazepam .5 mg every night; cont to stress good sleep hygiene; again warned of side effects of sedation, confusion, and impaired driving  4. impaired attention and focus- Cont Wellbutrin XL 300 mg/d   5. Possible excessive worrying-Cont Prozac 40 mg/d mg/day  6. mild obsessive compulsive symptoms (nail biting) - cont to monitor  7. Ongoing grief issues related to the death of her husband-cont psychotherapy  8. 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 Will cont to hold lithium.

## 2016-10-22 ENCOUNTER — Ambulatory Visit: Admit: 2016-10-22 | Payer: MEDICARE

## 2016-10-22 ENCOUNTER — Ambulatory Visit: Admit: 2016-10-22 | Discharge: 2016-10-22 | Payer: MEDICARE | Attending: Orthopaedic Surgery

## 2016-10-22 DIAGNOSIS — Z96612 Presence of left artificial shoulder joint: Secondary | ICD-10-CM

## 2016-10-22 NOTE — Progress Notes (Signed)
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  She feels significantly improved and has returned to Pilates.    REVIEW OF SYSTEMS: Pertinent items are noted in the HPI.  Review of symptoms reviewed from the Patient History Form and dated on X 18 are available in the patient's chart under the Media tab.    PHYSICAL EXAMINATION: Inspection of the affected left shoulder reveals a healed incision. The skin is warm. The deltoid contracts nicely.  The distal neurovascular exam is grossly intact.  Range of motion reveals 145?? of active forward elevation.  She can position her hand behind her head.  She has 5 minus out of 5 forward elevation and external rotation strength    Examination of the contralateral shoulder reveals no atrophy or deformity. The skin is warm and dry. Range of motion is within normal limits. There is no focal tenderness with palpation. Provocative SLAP, biceps tension, apprehension AC joint or rotator cuff tests are negative. Strength is graded 5/5 in all muscle groups. The distal neurovascular exam is grossly intact.    Cervical spine: The skin is warm and dry. There is no swelling, warmth, or erythema. Range of motion is within normal limits. There is no paraspinal or spinous process tenderness. Spurling's sign is negative and did not produce shoulder pain. The distal neurovascular exam is grossly intact.    X-RAYS:  True AP and axillary views of the left shoulder reveal appropriately placed, well located reverse total shoulder arthroplasty components.  There is no evidence of loosening nor is there any scapular notching    ASSESSMENT/PLAN: Doing well after reverse total shoulder arthroplasty.  The patient will continue with home exercises and will follow up in 1 year for repeat evaluation.  At that time I will repeat true AP and axillary views of the shoulder and the shoulder outcome forms will be completed.  The patient agreed with  our plan.

## 2017-03-30 NOTE — Progress Notes (Signed)
Patient requested a lorazepam refill. OARRS checked-no sign of abuse

## 2017-06-21 NOTE — Telephone Encounter (Signed)
Pt requested refill of lorazepam. OAARS checked-no sign of misuse.

## 2017-10-18 NOTE — Unmapped (Signed)
Patient requested a lorazepam refill. OARRS checked-no sign of abuse

## 2017-11-16 ENCOUNTER — Ambulatory Visit: Admit: 2017-11-16 | Discharge: 2017-11-21 | Payer: MEDICARE

## 2017-11-16 DIAGNOSIS — F5081 Binge eating disorder: Secondary | ICD-10-CM

## 2017-11-16 NOTE — Unmapped (Addendum)
Pt here for visit; her last visit was 1 year ago. She remains on Wellbutrin XL 300 mg/d, Prozac 40 mg/d, Trulicity 1.5 mg weekly, and lorazepam .5 mg qHS. It has been a rough year because her stepsons are selling Neil's belongings. When stressed or lonely, she has some depressed mood but is otherwise fine. She has occasional hypomanic symptoms that are not problematic. Her binge eating continues to be helped by the Trulicity though she still binges (and can gain up to 6 lbs/d) when under stress or exposed to sweets. She has had minimal anxiety. The lorazepam continues to be very helpful for sleep; she is unable to sleep without it. Her OARRS was reviewed and there is no indication of drug misuse. Her energy, focus, and concentration are good. She is functioning well and continues to have great relationships with her children and grandchildren.      Time: 43 minutes; Psychotherapy 30 minutes     Psychotherapy: Discussed her desire to continue Trulicity for the BED; discussed alternatives such as Ozempic.  Discussed that she still misses Lloyd Huger, but was finally able to take her wedding ring off. She is dating a widower but feels she needs to keep him at a distance.    MSE: Neatly dressed; good eye contact; alert, Ox3; normal mood and affect, normal speech, no dels or hals; memory grossly intact; no SI or HI; insight fair, judgement good    ROS: She remains in good medical health except for some calcification in her heart, which is aynptomatic. She recently had her annual physical and no problems were noted. She has no side effects from the Trulicity except for 1 episode of hypoglycemia (glu=51) last fall  that responded to IV glucose. She has no difficulty giving herself injections. She is tolerating the bupropion, Prozac 40 mg/d, and lorazepam well. She has no nausea, balance difficulties, or abnormal movements, including no tremor.  Weight is decreased but stable--she is at the weight she likes to be. She conts to  exercise regularly. There has been no vomiting, purging, or excessive exercise.     Labs:  11/16/17 wt=112.9 lbs  10/29/16 wt=118.2 lbs  03/03/16 wt=128.0  02/26/15 wt=126 lbs  07/05/14 wt=129.4 lbs  06/28/14 Li=.2, TSH=.88, T4=5.18    Diagnoses:  Binge eating disorder  Bipolar II disorder  ADD  Insomnia    Assessment and Management Plan (Prescribers):   1. dysregulated eating behavior -  Cont Wellbutrin XL 300 mg/d,  fluoxetine 40 mg/day, trulicity 1.5 mg/week, return 6 mos or sooner if needed  2. depressive symptoms - cont Wellbutrin XL and fluoxetine  3. insomnia-cont lorazepam .5 mg every night; cont to stress good sleep hygiene; again warned of side effects of sedation, confusion, and impaired driving  4. impaired attention and focus- Cont Wellbutrin XL 300 mg/d   5. Possible excessive worrying-Cont Prozac 40 mg/d mg/day  6. mild obsessive compulsive symptoms (nail biting) - cont to monitor  7. Ongoing grief issues related to the death of her husband-cont psychotherapy  8. 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 Will cont to hold lithium.  9. One episode of hypoglycemia on Trulicity--she will notify me immediately if this happens again.

## 2018-05-31 ENCOUNTER — Ambulatory Visit: Admit: 2018-05-31 | Discharge: 2018-06-05 | Payer: MEDICARE

## 2018-05-31 DIAGNOSIS — F5081 Binge eating disorder: Secondary | ICD-10-CM

## 2018-05-31 NOTE — Unmapped (Signed)
Pt here for visit; her last visit was 6 months ago. She remains on Wellbutrin XL 300 mg/d, Prozac 40 mg/d, Trulicity 1.5 mg weekly, and lorazepam 1 mg qHS (which we increased after the stressful news regarding her dead husband was in the news). She was doing well, with stable mood and less frequent binge eating, until recently when she had some interpersonal stressors that precipitated her going on a 3-day candy and sweets binge last week. She already feels much better (see psychotherapy note). She has occasional hypomanic symptoms that are not problematic. Her binge eating continues to be helped by the Trulicity, especially those binges that are due to a sudden increase in hunger.  The lorazepam 1 mg qHS continues to be very helpful for sleep; she is unable to sleep without it. Her OARRS was reviewed and there is no indication of drug misuse. Her energy, focus, and concentration are good. She is functioning well and continues to have great relationships with her children and grandchildren.      Time: 45 minutes; Psychotherapy 30 minutes     Psychotherapy:  Discussed stress from the relationship with the widower she has been dating, and how stress can increase her binge eating.  She is worried that he is ill in some way, and last week he went  to live with a son in [place] where he will get a medical evaluation. As soon as he left, she felt much better. Also discussed that she reduced the Prozac to 20 mg/d about 2 months ago because she was feeling so good. She has increased it back to 40 mg/d. We discussed the importance of her keeping her Prozac dose at 40 mg/d for keeping her binge eating under control.     MSE: Neatly dressed; good eye contact; alert, Ox3; normal mood and affect, normal speech, no dels or hals; memory grossly intact; no SI or HI; insight good, judgement good    ROS: She remains in good medical health except for some calcification in her heart, which is aynptomatic. She has no side effects from  the Trulicity, including no further epipisodes of hypoglycemia.  She has no difficulty giving herself injections. She is tolerating the bupropion, Prozac 40 mg/d, and lorazepam well. She has no nausea, abdominal pain, balance difficulties, or abnormal movements, including no tremor.  Weight is decreased but stable--she is at the weight she likes to be. She conts to exercise regularly (1-1.5 hours/day). There has been no vomiting, purging, or excessive exercise.     Labs:  05/31/18 wt=114.8 lbs  11/16/17 wt=112.9 lbs  10/29/16 wt=118.2 lbs  03/03/16 wt=128.0  02/26/15 wt=126 lbs  07/05/14 wt=129.4 lbs  06/28/14 Li=.2, TSH=.88, T4=5.18    Diagnoses:  Binge eating disorder  Bipolar II disorder  ADD  Insomnia    Assessment and Management Plan (Prescribers):   1. dysregulated eating behavior -  Cont Wellbutrin XL 300 mg/d,  fluoxetine 40 mg/day, trulicity 1.5 mg/week, return 6 mos or sooner if needed  2. depressive symptoms - cont Wellbutrin XL and fluoxetine  3. insomnia-cont lorazepam 1 mg every night; cont to stress good sleep hygiene; again warned of side effects of sedation, confusion, and impaired driving  4. impaired attention and focus- Cont Wellbutrin XL 300 mg/d   5. Possible excessive worrying-Cont Prozac 40 mg/d mg/day  6. mild obsessive compulsive symptoms (nail biting) - cont to monitor  7. Ongoing grief issues related to the death of her husband-cont psychotherapy  8. Past history of Li toxicity of  unknown cause on very low doses. Will cont to hold lithium.  9. One episode of hypoglycemia on Trulicity--she will notify me immediately if this happens again.

## 2018-05-31 NOTE — Plan of Care (Signed)
OP Treatment Plan    Nicole Odonnell  [id number]    [facility] TREATMENT PLAN:   Are there any changes to the Treatment Plan?:  No, there is no needed change to the previously completed Treatment Plan.

## 2019-05-25 ENCOUNTER — Ambulatory Visit: Admit: 2019-05-28 | Discharge: 2019-05-30 | Payer: MEDICARE

## 2019-05-25 DIAGNOSIS — F5081 Binge eating disorder: Secondary | ICD-10-CM

## 2019-05-25 NOTE — Unmapped (Signed)
OP Treatment Plan    Nicole Odonnell  [id number]    [facility] TREATMENT PLAN:   General Goals:  Reduce risk of symptomatic/syndromic/functional relapse or recurrence. Patient will remain psychiatrically stable by reducing or stabilizing his/her signs and symptoms of mental illness and maximizing his/her level of independence. Patient will be able to recognize, accept, and manage his/her mental health and/or substance misuse disorders, including working with the medical staff to manage his/her medications. Improve symptoms/maintain symptom improvement. Improve functioning/maintain functional improvement. Patient will report improved ability to attend to usual functions and roles. Patient will verbalize insight into the need and importance of receiving care for their symptoms. Patient will report stabilization of body weight and image.  Med/Som Specific:  Patient will report all signs and symptoms to the MD/NP. Patient will attend all scheduled appointments with MD/NP. Patient will engage in an informed consent discussion regarding medications for the treatment of their symptoms. Patient will demonstrate adherence to the medication treatment that is established. Patient will report any side effect issues related to the medications. Patient will agree to have MD/NP collaborate with other health professional as needed to ensure optimal care.  Therapy Specific:  Patient will attend and be an active participant in individual therapy. Patient will address psychosocial stresses that impact development of symptoms in therapy. Patient will demonstrate increased knowledge and implementation of coping strategies to reduce symptoms and improve functioning.  Expected Frequency of Visits:  Q 4 months/Ongoing  Prognosis:  Good

## 2019-05-25 NOTE — Unmapped (Signed)
This was a visit conducted by telephone because of the COVID-19 pandemic. I did a clinical assement of the patient, discussed my findings with the patient, and also discussed risks and benefits. She gave consent to have a phone visit.    OARRS--Dr Rivka Spring has been refilling her lorazepam, which I was unaware of until I checked OARRS. There is on indication of lorazepam misuse     This is our first clinical contact in about 1 year (I have asked her to meet with me every 6 months, but this often does not occur). She reports that her depression remains stable on Wellbutrin XL 300 mg/d, Prozac 60 mg/d, and lorazepam 1 mg qHS, she has had no hypomanic symptoms, she is sleeping well, but her binge eating (and nail biting--she is biting off fake nails) is out of control. She reports binge eating 3 times per week, primarily on candy and ice cream,  Her weight was up to 126 lbs (which was very distressing for her). She stopped the Trulicity, which was somewhat helpful for the binge eating,  because her insurance would not cover it. Her binge eating continues to be helped by the Trulicity, especially those binges that are due to a sudden increase in hunger.  The lorazepam 1 mg qHS continues to be very helpful for sleep; she is unable to sleep without it, and Dr Rivka Spring is now prescribing it. Her energy, focus, and concentration are good. She is functioning well, has a new boyfriend,  and continues to have great relationships with her children and grandchildren.      Time: 40 minutes; Psychotherapy 25 minutes     Psychotherapy:  Discussed that there are no new FDA-approved options for BED at this time (dasotraline was not approved for BED). Discussed that I have continued to have good clinical results with GLP-1 agonists for binge eating, but that this is an off label use of this class of drugs. I reminded her that CBT is also an option for BED, and she expressed interest in pursuing this option. She is going to research the  cost of 3 GLP-1 agonists - Ozempic, Saxenda, and Rybelsus. She really likes her new boyfriend, Vonna Kotyk.    MSE: Pt is alert, Ox3, and pleasant; mood and affect; speech is normal; there is no SI, HI, formal thought disorder, dels, or hals; memory is grossly intact; intelligence is estimated to be above average; insight is fair, judgement is fair-good    ROS: She remains in good medical health. 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 She is tolerating the bupropion, Prozac 60 mg/d, and lorazepam well. She has no nausea, abdominal pain, balance difficulties, or abnormal movements, including no tremor. She is worried about weight gain from the binge eating--her weight today is 122 lbs, which she feels is to high for her.  She conts to exercise regularly (1-1.5 hours/day). There has been no vomiting, purging, or excessive exercise. She is eating 3 healthy meals per day.    Medications:  Fluoxetine 60 mg daily  Wellbutrin XL 300 mg daily  Lorazepam 1 mg qHS--now prescribed by Dr Rivka Spring    Labs:  05/25/19 wt=122 lbs  05/31/18 wt=114.8 lbs  11/16/17 wt=112.9 lbs  10/29/16 wt=118.2 lbs  03/03/16 wt=128.0  02/26/15 wt=126 lbs  07/05/14 wt=129.4 lbs  06/28/14 Li=.2, TSH=.88, T4=5.18    Diagnoses:  Binge eating disorder  Bipolar II disorder  ADD  Insomnia    Assessment and Management Plan (Prescribers):   1. dysregulated eating behavior -  Cont Wellbutrin XL 300 mg/d,  fluoxetine 60 mg/day, to explore GLP-1 agonist options and get back with me. She wants me to find a therapist who will do CBT with her, and I agreed to do that.  2. depressive symptoms - cont Wellbutrin XL and fluoxetine  3. insomnia-cont lorazepam 1 mg every night (now prescribed by her internist Dr.Maeder ; cont to stress good sleep hygiene; again warned of side effects of sedation, confusion, and impaired driving  4. impaired attention and focus- Cont Wellbutrin XL 300 mg/d   5. Possible excessive worrying-Cont Prozac 60 mg/d mg/day  6. mild obsessive  compulsive symptoms (nail biting) - to find a CBT therapist  7. Ongoing grief issues related to the death of her husband-cont psychotherapy  8. 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 Will cont to hold lithium.  9. One episode of hypoglycemia on Trulicity--she will notify me immediately if this happens again.

## 2019-05-25 NOTE — Progress Notes (Addendum)
This note was duplicated

## 2020-01-11 ENCOUNTER — Ambulatory Visit: Admit: 2020-01-11 | Discharge: 2020-01-16 | Payer: MEDICARE

## 2020-01-11 DIAGNOSIS — F5081 Binge eating disorder: Secondary | ICD-10-CM

## 2020-01-11 NOTE — Unmapped (Addendum)
This was a visit conducted by telephone because of the COVID-19 pandemic. I did a clinical assement of the patient, discussed my findings with the patient, and also discussed risks and benefits. She gave consent to have a phone visit.    [facility]--Dr Rivka Spring continues to refill her lorazepam. There is no indication of lorazepam misuse     This is our first clinical contact in about 7 months. She reports that her depression remains stable on Wellbutrin XL 300 mg/d, Prozac 60 mg/d, and lorazepam 1 mg qHS, she has had no hypomanic symptoms, she is sleeping well, but she is still binge eating about 2 times/week, primarily on candy and ice cream. Her weight was today is 118 lbs. The lorazepam 1 mg qHS continues to be very helpful for sleep; she is unable to sleep without it. Her energy, focus, and concentration are good. She is functioning well, has a new boyfriend, and continues to have great relationships with her children and grandchildren.      Time: 30 minutes; Psychotherapy 20 minutes     Psychotherapy:  Yet again discussed that there are no new FDA-approved options for BED at this time. She does not want to pursue CBT or off-label use of a GLP-1 agonists for binge eating. We discussed the possibility of a small increase in Prozac-80 mg q 3 days to see if that helps with the binge eating. She is getting along well with her boyfriend, Vonna Kotyk. She is upset about her grandson Elliot Cousin has autism and diabetes.    MSE: Pt is alert, Ox3, and pleasant; mood and affect; speech is normal; there is no SI, HI, formal thought disorder, dels, or hals; memory is grossly intact; intelligence is estimated to be above average; insight is fair, judgement is fair-good    ROS: She remains in good medical health. 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 She is tolerating the bupropion, Prozac 60 mg/d, and lorazepam well. She has no nausea, abdominal pain, balance difficulties, or abnormal movements, including no tremor. She conts  to exercise regularly (1-1.5 hours/day). There has been no vomiting, purging, or excessive exercise. She is eating 3 healthy meals per day.    Medications:  Fluoxetine 60 mg daily  Wellbutrin XL 300 mg daily  Lorazepam 1 mg qHS--now prescribed by Dr Rivka Spring    Labs:  01/11/20 wt=118 lbs  05/25/19 wt=122 lbs  05/31/18 wt=114.8 lbs  11/16/17 wt=112.9 lbs  10/29/16 wt=118.2 lbs  03/03/16 wt=128.0  02/26/15 wt=126 lbs  07/05/14 wt=129.4 lbs  06/28/14 Li=.2, TSH=.88, T4=5.18    Diagnoses:  Binge eating disorder  Bipolar II disorder  ADD  Insomnia    Assessment and Management Plan (Prescribers):   1. dysregulated eating behavior -  Cont Wellbutrin XL 300 mg/d,  fluoxetine 60 mg/day, to explore increasing the fluoxetine to 80 mg q 3 days. She is to return in 6 months and will call me sooner if she needs to.  2. depressive symptoms - cont Wellbutrin XL and fluoxetine  3. insomnia-cont lorazepam 1 mg every night (now prescribed by her internist Dr.Maeder; cont to stress good sleep hygiene; again warned of side effects of sedation, confusion, and impaired driving  4. impaired attention and focus - Cont Wellbutrin XL 300 mg/d   5. Possible excessive worrying-Cont Prozac 60 mg/d mg/day; monitor on slight increase in fluoxetine dose  6. mild obsessive compulsive symptoms (nail biting) - monitor on slight increase in fluoxetine dose  7. Ongoing grief issues related to the death of  her husband-cont psychotherapy  8. 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 Will cont to hold lithium.  9. One episode of hypoglycemia on Trulicity. We will avoid GLP-1 analogues for now

## 2020-02-29 ENCOUNTER — Institutional Professional Consult (permissible substitution): Admit: 2020-02-29 | Discharge: 2020-03-04 | Payer: MEDICARE | Attending: Clinical

## 2020-02-29 DIAGNOSIS — F5081 Binge eating disorder: Secondary | ICD-10-CM

## 2020-02-29 NOTE — Unmapped (Signed)
ED session with patient, referral by Dr.McElroy for the treatment of BED  [id number]  Diagnosis : Binge eating disorder  BMI 20.5  54.6kg; 163.1cm  Pt is a 74y old,widow, 2 kids in their 40s whom she is close to. She binges 2/week on average, example is a drive through [employer] when she will get ice cream,dohnuts, desserts and eat them all impulsively. She was unable to provide a 24hrs recall of what she eats, seems like she is restricting her intake.  She wants to lose weight and be 5 lbs less than her current weight,unsure why. She is lonely and often thinking about death and dying. Pt has a good support system and a boyfriend who is a fun company but she likes to keep her boundaries. Psychoeducation about the CBT-E model for BED provided. She is not interested at seeing a RD at this time. C.is very concerned with what are good and right foods , discussed how she thinks people who are overweight are not very smart to be putting themselves in this situation. She explored a bit her childhood and how this affected her sugar addiction. Regulated daily nutrition explored with general examples. Denied major mood issues but was tearful a few times during the visit. She exercises daily, 1.5hrs on average, does not think it is excessive. Pt is motivated to learn more about how to provide better self care for herself and how to rethink her high standarts to help her grow in recovery. Denied SI,psychoses,drug or alcohol issues. RTC in 1 week

## 2020-03-14 ENCOUNTER — Institutional Professional Consult (permissible substitution): Admit: 2020-03-14 | Payer: MEDICARE | Attending: Clinical

## 2020-03-14 DIAGNOSIS — F5081 Binge eating disorder: Secondary | ICD-10-CM

## 2020-03-14 NOTE — Unmapped (Signed)
In person visit  11-12  Diagnosis: Binge eating disorder    Pt kept logs, discussed. She eats very healthy and ins moderation most of the time and would lose control and binge eat 2/week. Circumstances around her binges were explored, she has good insight into why this happens . She spoke to how she planned the binge last night after keeping it together for 2 weeks while traveling. Various scenarios worked out theoretically to see if she would like to try a different approach. She is terrified of eating a treat during the day as she feels it will get our of control very quickly and she will end up binging all the time. Being good is important for her,she discussed how her children would be ashamed of her if they knew she was drinking diet coke. She is interested in meditation / yoga but is resistant to starting doing this, barriers to incorporating those practices worked through. C. Processed some stories from her childhood and how she felt they shaped her relationship with food. Self compassion strategies explained at length. Removing moral labels from food discussed. 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 Coverage while this provider is [facility] explained. Denied SI, severe depression or anxiety. Sleeps well mostly when she follows her schedule. Socializes fine but gets fatigued from overstimulation. RTC in 2 weeks

## 2020-04-02 ENCOUNTER — Institutional Professional Consult (permissible substitution): Admit: 2020-04-02 | Payer: MEDICARE | Attending: Clinical

## 2020-04-02 DIAGNOSIS — F509 Eating disorder, unspecified: Secondary | ICD-10-CM

## 2020-04-02 NOTE — Unmapped (Signed)
ED session with pt  In person visit  11-12am  Visit  diagnosis :   Eating disorder with ongoing treatment    Nicole Odonnell is doing so so. She had 2 binge eating episodes in the last week- she feels she self-harms/ self sabotages during those times. She is unsure why she is chose to hurt herself like that, she generally likes herself and is proud of who she is as a friend and a parent. She purged 1 time because she was so very full that she got sick, not to counteract the effects of BE. Effects of excessive exercise on eating behaviors discussed. She works out 2hrs/day as she feels good after it but is most probably not eating enough to compensate for this energy expenditure. Reported mild depression. She has a lot of free time but is not willing take on more responsibiltuies.She bought a house in [place] [facility] [place] that she plans to decorate and work on. Good relationship with significant other; he stays over a few days a week and she likes the companionship. She quit logging food/benefits of this processed again. Refused to see RD at this time. Will schedule with Dr.McElroy to discuss medication managment again. Harm reduction model and self compassion as important part of recovery explored. She was advised to try to limit the amount of binge foods once she start the binge and to work on triggers/ binge auras that she is able to recognize. Coping strategies during those times discussed opposite action; TIPP,taking a shower, science behind riding the urge. Denied Si, drug or alcohol abuse. She wanted to talk about her childhood and how it affected her choices as an adult. Acceptance and forgiveness explored. RTC in 2-3 weeks when she is back in town after her trip.

## 2020-04-02 NOTE — Unmapped (Signed)
OP Treatment Plan    Nicole Odonnell  [id number]    [facility] TREATMENT PLAN     General Goals:  Reduce risk of symptomatic/syndromic/functional relapse or recurrence. Improve symptoms/maintain symptom improvement. Improve functioning/maintain functional improvement.  Med/Som Specific:  Patient will report all signs and symptoms to the MD/NP. Patient will attend all scheduled appointments with MD/NP. Patient will engage in an informed consent discussion regarding medications for the treatment of their symptoms. Patient will demonstrate adherence to the medication treatment that is established.  Therapy Specific:  Patient will attend and be an active participant in individual therapy. She will consider meeting with RD, logging food intake and B/P episodes.  Expected Frequency of Visits:  Weekly/time-limited  Prognosis:  Good

## 2020-10-29 ENCOUNTER — Ambulatory Visit: Admit: 2020-10-29 | Payer: MEDICARE

## 2020-10-29 DIAGNOSIS — F5081 Binge eating disorder: Secondary | ICD-10-CM

## 2020-10-29 NOTE — Unmapped (Addendum)
This was a an in-person visit.    [facility]--Dr Rivka Spring continues to refill her lorazepam. There is no indication of lorazepam misuse     This is our first clinical contact in about 10 months. She was begun on Rybelsus (oral semaglutide) 3mg /d for treatment resistant BED for about 5 months. After starting the medication she had no binges for 3 weeks, but they recurred after that. She has been having 1 binge/week (which is better than her baseline 3 binges/week), and feels it is continuing to help. Her weight today is 116.8 lbs. She reports that her depression remains stable on Wellbutrin XL 300 mg/dand  Prozac 60 mg/d and she has had no hypomanic symptoms. Her energy, focus, and concentration are good. She sleeps well as long as she takes 1 mg lorazepam qHS. She tried to reduce it to .5 mg qHS but was unable to sleep, so increased it back to 1 mg qHS. She is functioning well, is getting along well with her boyfriend, and continues to have great relationships with her children and grandchildren.      Time: 30 minutes; Psychotherapy 20 minutes     Psychotherapy:  Discussed increasing the Rybelsus to 7 mg/d and she would like to try that. Warned her of potential side effects of pancreatitis and hypoglycemia.  She remains upset about her grandson Elliot Cousin has autism, epilepsy,  and diabetes.    MSE: Pt is alert, Ox3, and pleasant; mood and affect; speech is normal; there is no SI, HI, formal thought disorder, dels, or hals; memory is grossly intact; intelligence is estimated to be above average; insight is fair, judgement is fair-good    ROS: She remains in good medical health. 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 She is tolerating the bupropion, Prozac 60 mg/d, Rybelsus, and lorazepam well. She has no nausea, abdominal pain, balance difficulties, or abnormal movements, including no tremor. She continues to have some mild word finding difficulties but it does not affect her function. She conts to exercise  regularly (1-1.5 hours/day). There has been no vomiting, purging, or excessive exercise. She is eating 3 healthy meals per day.    Medications:  Fluoxetine 60 mg daily  Wellbutrin XL 300 mg daily  Rybelsus 3mg /d  Lorazepam 1 mg qHS--now prescribed by Dr Rivka Spring    Labs:  10/29/20 wt=116.8 lbs  01/11/20 wt=118 lbs  05/25/19 wt=122 lbs  05/31/18 wt=114.8 lbs  11/16/17 wt=112.9 lbs  10/29/16 wt=118.2 lbs  03/03/16 wt=128.0  02/26/15 wt=126 lbs  07/05/14 wt=129.4 lbs  06/28/14 Li=.2, TSH=.88, T4=5.18    Diagnoses:  Binge eating disorder  Bipolar II disorder  ADD  Insomnia    Assessment and Management Plan (Prescribers):   1. dysregulated eating behavior -  Increase Rybelsus to 7 mg qAM, Cont Wellbutrin XL 300 mg/d, cont  fluoxetine 60 mg/day. She is to return in 6 months and will call me sooner if she needs to.  2. depressive symptoms - cont Wellbutrin XL and fluoxetine  3. insomnia-cont lorazepam 1 mg every night (now prescribed by her internist Dr.Maeder; cont to stress good sleep hygiene; again warned of side effects of sedation, confusion, and impaired driving  4. impaired attention and focus - Cont Wellbutrin XL 300 mg/d   5. Possible excessive worrying-Cont Prozac 60 mg/d mg/day  6. mild obsessive compulsive symptoms (nail biting) - Cont Prozac 60 mg/d  7. Ongoing grief issues related to the death of her husband-cont psychotherapy  8. Past history of Li toxicity of unknown cause  on very low doses. Will cont to hold lithium.  9. One episode of hypoglycemia on Trulicity. No evidence for this on Rybelsus. Pt again warned of this possibility.

## 2020-10-29 NOTE — Unmapped (Signed)
OP Treatment Plan    Nicole Odonnell  [id number]    [facility] TREATMENT PLAN:   General Goals:  Reduce risk of symptomatic/syndromic/functional relapse or recurrence. Patient will remain psychiatrically stable by reducing or stabilizing his/her signs and symptoms of mental illness and maximizing his/her level of independence. Patient will be able to recognize, accept, and manage his/her mental health and/or substance misuse disorders, including working with the medical staff to manage his/her medications. Improve symptoms/maintain symptom improvement. Improve functioning/maintain functional improvement. Patient will report improved ability to attend to usual functions and roles. Patient will verbalize insight into the need and importance of receiving care for their symptoms. Patient will report stabilization of body weight and image.  Med/Som Specific:  Patient will report all signs and symptoms to the MD/NP. Patient will attend all scheduled appointments with MD/NP. Patient will engage in an informed consent discussion regarding medications for the treatment of their symptoms. Patient will demonstrate adherence to the medication treatment that is established. Patient will report any side effect issues related to the medications. Patient will agree to have MD/NP collaborate with other health professional as needed to ensure optimal care.  Therapy Specific:  Patient will attend and be an active participant in individual therapy. Patient will demonstrate increased knowledge and implementation of coping strategies to reduce symptoms and improve functioning.  Expected Frequency of Visits:  2x/year/Ongoing  Prognosis:  Good

## 2021-06-21 ENCOUNTER — Inpatient Hospital Stay
Admit: 2021-06-21 | Discharge: 2021-06-21 | Disposition: A | Discharge status: Home / Self Care | Payer: MEDICARE | Attending: Emergency Medicine

## 2021-06-21 ENCOUNTER — Emergency Department: Admit: 2021-06-21 | Payer: MEDICARE

## 2021-06-21 DIAGNOSIS — S0181XA Laceration without foreign body of other part of head, initial encounter: Secondary | ICD-10-CM

## 2021-06-21 DIAGNOSIS — W19XXXA Unspecified fall, initial encounter: Secondary | ICD-10-CM

## 2021-06-21 MED ORDER — ACETAMINOPHEN 500 MG PO TABS
500 MG | Freq: Once | ORAL | Status: AC
Start: 2021-06-21 — End: 2021-06-21
  Administered 2021-06-21: 21:00:00 1000 mg via ORAL

## 2021-06-21 MED ORDER — HYDROCODONE-ACETAMINOPHEN 5-325 MG PO TABS
5-325 MG | Freq: Once | ORAL | Status: DC
Start: 2021-06-21 — End: 2021-06-21

## 2021-06-21 MED ORDER — TETANUS-DIPHTH-ACELL PERTUSSIS 5-2.5-18.5 LF-MCG/0.5 IM SUSP
5-2.5-18.5-0.5 LF-MCG/0.5 | Freq: Once | INTRAMUSCULAR | Status: AC
Start: 2021-06-21 — End: 2021-06-21
  Administered 2021-06-21: 21:00:00 0.5 mL via INTRAMUSCULAR

## 2021-06-21 MED FILL — BOOSTRIX 5-2.5-18.5 LF-MCG/0.5 IM SUSP: INTRAMUSCULAR | Qty: 0.5

## 2021-06-21 MED FILL — ACETAMINOPHEN 500 MG PO TABS: 500 MG | ORAL | Qty: 2

## 2021-06-21 MED FILL — HYDROCODONE-ACETAMINOPHEN 5-325 MG PO TABS: 5-325 MG | ORAL | Qty: 1

## 2021-06-21 NOTE — ED Notes (Signed)
Dr Renaldo Harrison at bedside to see pt.      Wilma Flavin, RN  06/21/21 [phone]

## 2021-06-21 NOTE — ED Provider Notes (Signed)
[facility] EMERGENCY DEPT VISIT      Patient Identification  Catherine Roth is a 75 y.o. female.    Chief Complaint   Fall (Pt fell in woods and landed on a rock with her chin and states that she did spit out some teeth and has laceration to chin and pain up to her ears and denies loc)      History of Present Illness:    History was obtained from patient.  This is a  75 y.o. female who presents ambulatory  to the ED with complaints of a fall.  She tripped over a rock while walking in the woods and fell hitting her chin against a rock.  She did not lose consciousness and denies headache, dizziness, or nausea.  She sustained a laceration to her chin but the pain is spreading from the chin up into her jaw particularly on the right side towards her ear.  It hurts to swallow with some minor difficulty swallowing.  No trouble breathing.  No epistaxis.  Denies neck or back pain.  No rib pain.  No arm or leg pain.Marland Kitchen     History reviewed. No pertinent past medical history.    Past Surgical History:   Procedure Laterality Date    HIP SURGERY      HYSTERECTOMY (CERVIX STATUS UNKNOWN)      TONSILLECTOMY         No current facility-administered medications for this encounter.    Current Outpatient Medications:     mupirocin (BACTROBAN NASAL) 2 % nasal ointment, Take by Nasal route 2 times daily., Disp: 1 Tube, Rfl: 3    FLUOXETINE HCL PO, Take 60 mg by mouth, Disp: , Rfl:     rosuvastatin (CRESTOR) 20 MG tablet, Take 10 mg by mouth, Disp: , Rfl:     therapeutic multivitamin-minerals (THERAGRAN-M) tablet, Take 1 tablet by mouth daily., Disp: , Rfl:     buPROPion SR (WELLBUTRIN SR) 150 MG SR tablet, Take 150 mg by mouth daily., Disp: , Rfl:     lithium 150 MG capsule, Take 150 mg by mouth 3 times daily (with meals)., Disp: , Rfl:     No Known Allergies    Social History     Socioeconomic History    Marital status: Married     Spouse name: Not on file    Number of children: Not on file    Years of education: Not on file    Highest  education level: Not on file   Occupational History    Not on file   Tobacco Use    Smoking status: Never    Smokeless tobacco: Not on file   Substance and Sexual Activity    Alcohol use: Yes     Comment: rair    Drug use: Yes    Sexual activity: Not on file   Other Topics Concern    Not on file   Social History Narrative    Not on file     Social Determinants of Health     Financial Resource Strain: Not on file   Food Insecurity: Not on file   Transportation Needs: Not on file   Physical Activity: Not on file   Stress: Not on file   Social Connections: Not on file   Intimate Partner Violence: Not on file   Housing Stability: Not on file       Nursing Notes Reviewed      ROS:  General: no fever  ENT: no sinus  congestion, no sore throat  RESP: no cough, no shortness of breath  CARDIAC: no chest pain  GI: no abdominal pain, no vomiting, no diarrhea  Musculoskeletal: no arthralgia, no myalgia, no back pain,  no joint swelling  NEURO: no headache, no numbness, no weakness, no dizziness  DERM: no rash, no erythema, + ecchymosis, + wounds      PHYSICAL EXAM:  GENERAL APPEARANCE: [name] is in no acute respiratory distress. Awake and alert.  VITAL SIGNS:   ED Triage Vitals [06/21/21 1436]   Enc Vitals Group      BP 134/79      Heart Rate 71      Resp 16      Temp 97.9 ??F (36.6 ??C)      Temp Source Oral      SpO2 97 %      Weight 118 lb 4 oz (53.6 kg)      Height 5\' 5"  (1.651 m)      Head Circumference       Peak Flow       Pain Score       Pain Loc       Pain Edu?       Excl. in GC?      HEAD: Normocephalic, atraumatic.  EYES:  PERRL. extraocular muscles are intact.  Conjunctivas are pink. Negative scleral icterus.   ENT:  Mucous membranes are moist.  Pharynx without erythema or exudates. Right greater than left jaw swelling with bilateral TMJ tenderness. Decreased mouth opening. No loose teeth, no obvious intraaoral lacerations. Bruising to upper lip.handling secretions well  NECK: Nontender and supple. Cervical  spine nontender  CHEST: Clear to auscultation bilaterally. No rales, rhonchi, or wheezing.  HEART:  Regular rate and rhythm. No murmurs. Strong and equal pulses in the upper and lower extremities.   ABDOMEN: Soft,  nondistended, positive bowel sounds. abdomen is nontender. No guarding. No flank tenderness  MUSCULOSKELETAL:  Active range of motion of the upper and lower extremities. No edema. No thoracic or lumbar spine tenderness  NEUROLOGICAL: Awake, alert and oriented x 3. Power intact in the upper and lower extremities.   DERMATOLOGIC: No petechiae, rashes, or ecchymoses. 2cm jagged gaping laceration on chin      ED COURSE AND MEDICAL DECISION MAKING:      Radiology:  All plain films have been evaluated by myself. They may have been overread by radiologist as noted in chart. Other radiologic studies (i.e. CT, MRI, ultrasounds, etc ) have been interpreted by radiologist.     CT FACIAL BONES WO CONTRAST   Final Result      1. No acute fracture of the visualized maxillofacial bones.   2. Nasal septal perforation.   3. Bilateral temporomandibular severe arthrosis, worse on the left.          Labs:  No results found for this visit on 06/21/21.    Treatment in the department:  Patient received the following while in the ED:  Medications   Tetanus-Diphth-Acell Pertussis (BOOSTRIX) injection 0.5 mL (0.5 mLs IntraMUSCular Given 06/21/21 1539)   acetaminophen (TYLENOL) tablet 1,000 mg (1,000 mg Oral Given 06/21/21 1600)       14/4/22 or their surrogate had an opportunity to ask questions, and the risks, benefits, and alternatives were discussed. The wound located chin was prepped and draped to maintain a sterile field.  The wound was anesthetized with 1% lido with epi. It was copiously irrigated. It was explored to its depth  in a bloodless field with no sign of tendon, nerve, or vascular injury. No foreign bodies were identified. It was closed with 7 6-0 prolene sutures. There were no complications during the  procedure.      Medical decision making and differential diagnosis:  Patient with mechanical fall. Impact to chin/jaw. No loc. No headache or concussive symptoms and not on blood thinners. No CT head felt to be necessary. No neck pain with good ROM of neck without pain so no cervical spine imaging ordered. Only injury to jaw. Has malocclusion reported. CT imaging shows no fracture or dislocation of jaw, only severe arthritis. Wound closed.       I estimate there is LOW risk for SUBARACHNOID HEMORRHAGE,  INTRACRANIAL HEMORRHAGE, SUBDURAL HEMATOMA, FACIAL FRACTURE, SEVERE CONCUSSION, JAW DISLOCATION,  VERTEBRAL OR CAROTID DISSECTION, thus I consider the discharge disposition reasonable. [name] and I have discussed the diagnosis and risks, and we agree with discharging home to follow-up with their primary doctor. We also discussed returning to the Emergency Department immediately if new or worsening symptoms occur. We have discussed the symptoms which are most concerning (e.g., changing or worsening pain, weakness, vomiting, fever, mental status changes, speech difficulty, fainting) that necessitate immediate return.      Clinical Impression:  1. Fall, initial encounter    2. Chin laceration, initial encounter    3. Contusion of jaw, initial encounter    4. Arthritis of both temporomandibular joints        Dispo:  Patient will be discharged  at this time. Patient was informed of this decision and agrees with plan. I have discussed lab and xray findings with patient and they understand. Questions were answered to the best of my ability.    Followup:  Elon Spanner    Schedule an appointment as soon as possible for a visit   For suture removal in approximatelyl 1 week    dentist    Schedule an appointment as soon as possible for a visit       Discharge vitals:  Blood pressure 134/79, pulse 71, temperature 97.9 ??F (36.6 ??C), temperature source Oral, resp. rate 16, height 5\' 5"  (1.651 m), weight 118 lb 4 oz  (53.6 kg), SpO2 97 %.    Prescriptions given:   Discharge Medication List as of 06/21/2021  5:01 PM            This chart was created using dragon voice recognition software.        14/10/2020, MD  06/21/21 2129

## 2021-06-21 NOTE — Discharge Instructions (Addendum)
Ice to your jaw and chin.  Tylenol and or Motrin or Aleve for pain.  Suture removal in approximately 7 days.  Soft diet.  Return for fever, redness or swelling or purulent drainage from the wound.  Antibiotic ointment twice daily for the first 3 days then leave dry.

## 2021-06-21 NOTE — ED Notes (Signed)
MD at bedside to discuss results and plan of care with pt.      Wilma Flavin, RN  06/21/21 [phone]

## 2021-06-21 NOTE — ED Notes (Addendum)
Ice to both sides of jaw.     Gwynneth Aliment, RCP  06/21/21 1505       Gwynneth Aliment, [place]  06/21/21 [phone]

## 2021-09-17 ENCOUNTER — Ambulatory Visit: Admit: 2021-09-17 | Payer: MEDICARE

## 2021-09-17 DIAGNOSIS — F5081 Binge eating disorder: Secondary | ICD-10-CM

## 2021-09-17 NOTE — Plan of Care (Signed)
OP Treatment Plan    Nicole Odonnell  [id number]    [facility] TREATMENT PLAN:   General Goals:  Reduce risk of symptomatic/syndromic/functional relapse or recurrence. Patient will remain psychiatrically stable by reducing or stabilizing his/her signs and symptoms of mental illness and maximizing his/her level of independence. Patient will be able to recognize, accept, and manage his/her mental health and/or substance misuse disorders, including working with the medical staff to manage his/her medications. Improve symptoms/maintain symptom improvement. Patient will verbalize insight into the need and importance of receiving care for their symptoms. Patient will report stabilization of body weight and image.  Med/Som Specific:  Patient will report all signs and symptoms to the MD/NP. Patient will attend all scheduled appointments with MD/NP. Patient will engage in an informed consent discussion regarding medications for the treatment of their symptoms. Patient will demonstrate adherence to the medication treatment that is established. Patient will report any side effect issues related to the medications. Patient will agree to have MD/NP collaborate with other health professional as needed to ensure optimal care.  Therapy Specific:  Patient will attend and be an active participant in individual therapy. Patient will address psychosocial stresses that impact development of symptoms in therapy. Patient will demonstrate increased knowledge and implementation of coping strategies to reduce symptoms and improve functioning.  Expected Frequency of Visits:  Q 4 months/Ongoing  Prognosis:  Good

## 2021-09-17 NOTE — Unmapped (Signed)
Addended by: Ricki Rodriguez on: 09/18/2021 08:10 AM     Modules accepted: Level of Service

## 2021-09-17 NOTE — Unmapped (Addendum)
PLEASE DO NOT BILL FOR THIS APPOINTMENT-[place]    This was a phone visit. Patient consented to a phone visit.     [facility]--Dr Rivka SpringMaeder continues to refill her lorazepam. There is no indication of lorazepam misuse     This is our first clinical contact in about 9 months. She was begun on Rybelsus (oral semaglutide) 3mg /d, which was increased to 7mg /d--the drug stopped working.  She is now on fluoxetine 60 mg/d, bupropion 300 mg/d and lorazepam 1 mg/d. Her mood is stable with no hypomanic symptoms. Her energy, focus, and concentration are good. She sleeps well as long as she takes 1 mg lorazepam qHS. Her weight today is 125 lbs. She is binge eating at least 3 times per week, typically on sugary foods.  The binge eating is very distressing to her. She is functioning well and continues to have great relationships with her children and grandchildren.      Time: 34 minutes; Psychotherapy 20 minutes     Psychotherapy:  Discussed beginning Wegovy for her BED, and reminded her that this is an off label use. . Warned her of potential side effects of pancreatitis and hypoglycemia.  She remains upset about her grandson Elliot CousinJack--he has autism, epilepsy, and diabetes.    MSE: Pt is alert, Ox3, and pleasant; mood and affect are normal; speech is normal; there is no SI, HI, formal thought disorder, dels, or hals; memory is grossly intact; intelligence is estimated to be above average; insight is fair, judgement is fair-good    ROS: She remains in good medical health. 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 She is tolerating the bupropion, Prozac 40 mg/d, and lorazepam well. She has no nausea, abdominal pain, balance difficulties, or abnormal movements, including no tremor. She continues to have some mild word finding difficulties but it does not affect her function. She conts to exercise regularly (1-1.5 hours/day). There has been no vomiting, purging, or excessive exercise. She is eating 3 healthy meals per  day.    Medications:  Fluoxetine 60 mg daily  Wellbutrin XL 300 mg daily  Lorazepam 1 mg qHS--now prescribed by Dr Rivka SpringMaeder    Labs:  10/29/20 wt=116.8 lbs  01/11/20 wt=118 lbs  05/25/19 wt=122 lbs  05/31/18 wt=114.8 lbs  11/16/17 wt=112.9 lbs  10/29/16 wt=118.2 lbs  03/03/16 wt=128.0  02/26/15 wt=126 lbs  07/05/14 wt=129.4 lbs  06/28/14 Li=.2, TSH=.88, T4=5.18    Diagnoses:  Binge eating disorder  Bipolar II disorder  ADD  Insomnia    Assessment and Management Plan (Prescribers):   1. dysregulated eating behavior -  Begin Wegovy .25 mg/d, Cont Wellbutrin XL 300 mg/d, cont  fluoxetine 60 mg/day. She is to return in 1 month and will call me sooner if she needs to.  2. depressive symptoms - cont Wellbutrin XL and fluoxetine  3. insomnia-cont lorazepam 1 mg every night (now prescribed by her internist Dr.Maeder; cont to stress good sleep hygiene; again warned of side effects of sedation, confusion, and impaired driving  4. impaired attention and focus - Cont Wellbutrin XL 300 mg/d   5. Possible excessive worrying-Cont Prozac 60 mg/d mg/day  6. mild obsessive compulsive symptoms (nail biting) - Cont Prozac 60 mg/d  7. Ongoing grief issues related to the death of her husband-cont psychotherapy  8. 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 Will cont to hold lithium.  9. One episode of hypoglycemia on Trulicity. No evidence for this on Rybelsus. Pt again warned  of this possibility.

## 2022-08-27 IMAGING — DX CHEST 2 VIEWS
2 series · 2 of 2 positions shown · non-contrast
Comparison: None.

________________________________________________________________________________________________ 
CHEST 2 VIEWS, 08/27/2022 [DATE]: 
CLINICAL INDICATION: Other specified symptoms and signs involving the 
circulatory and respiratory systems..

[PA]
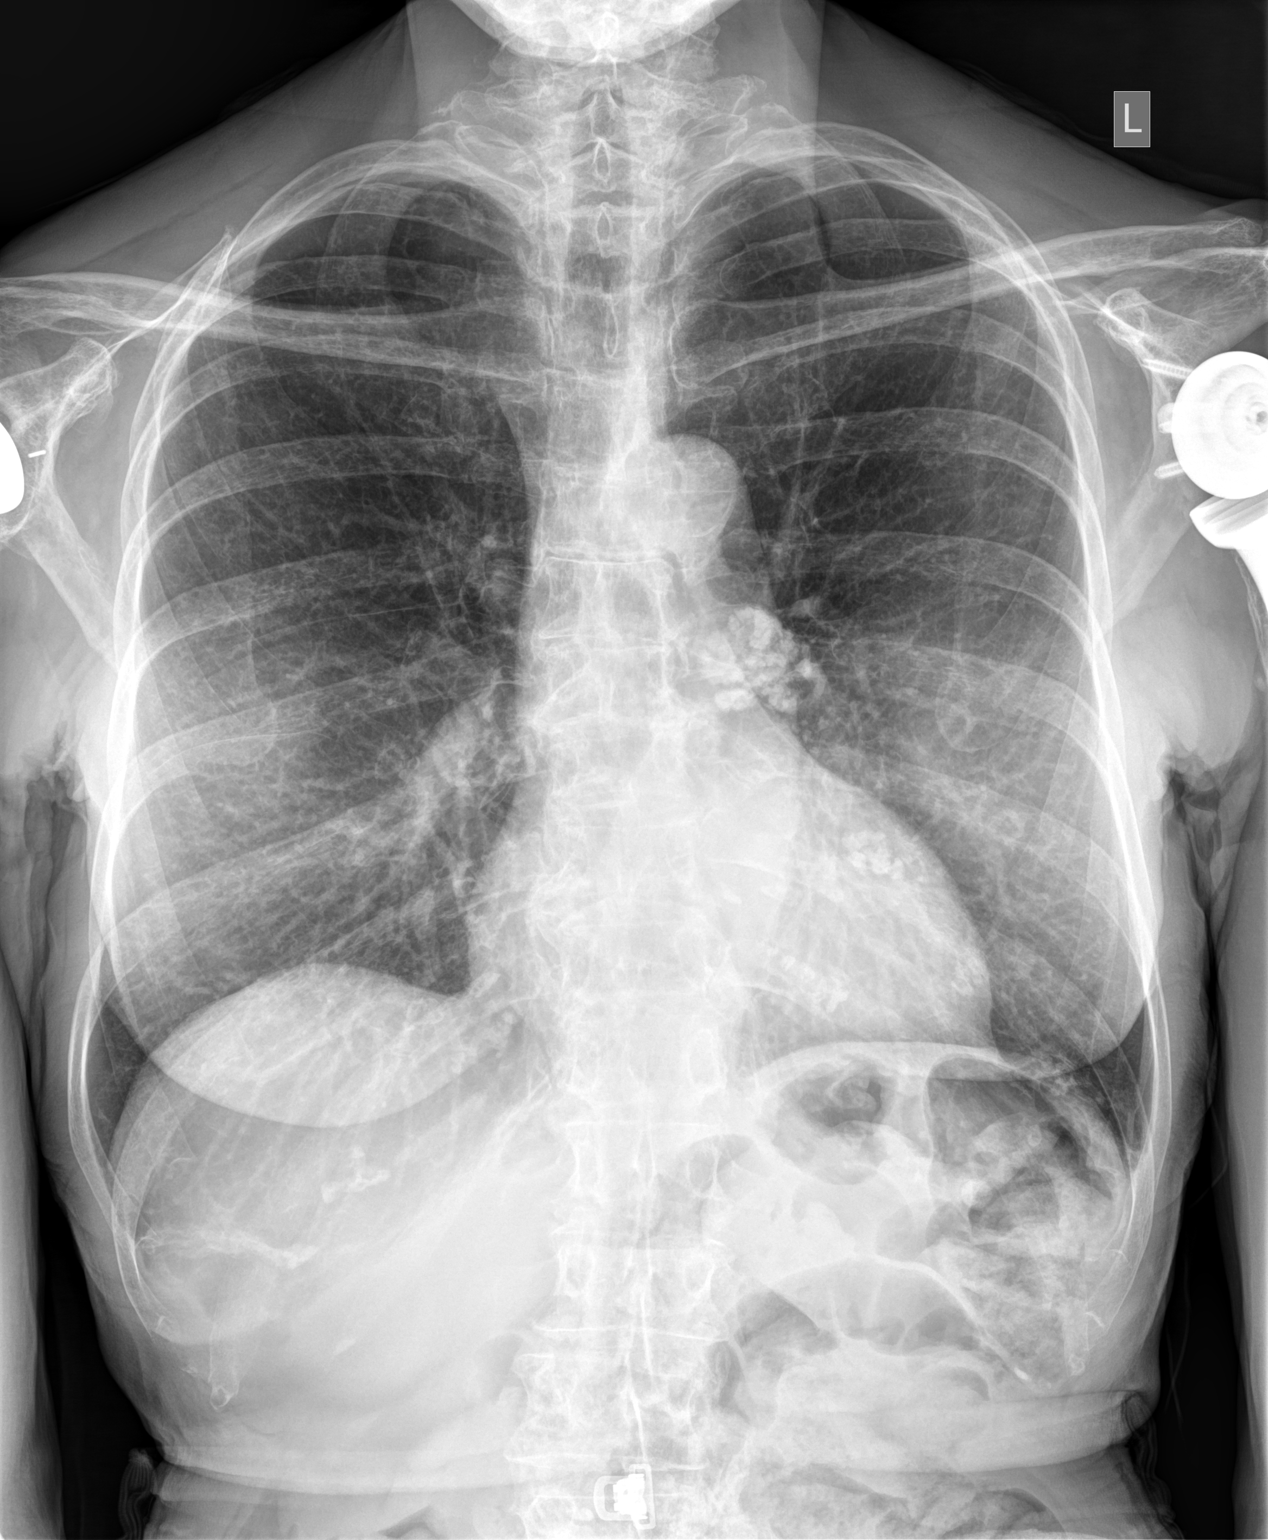

[left lateral]
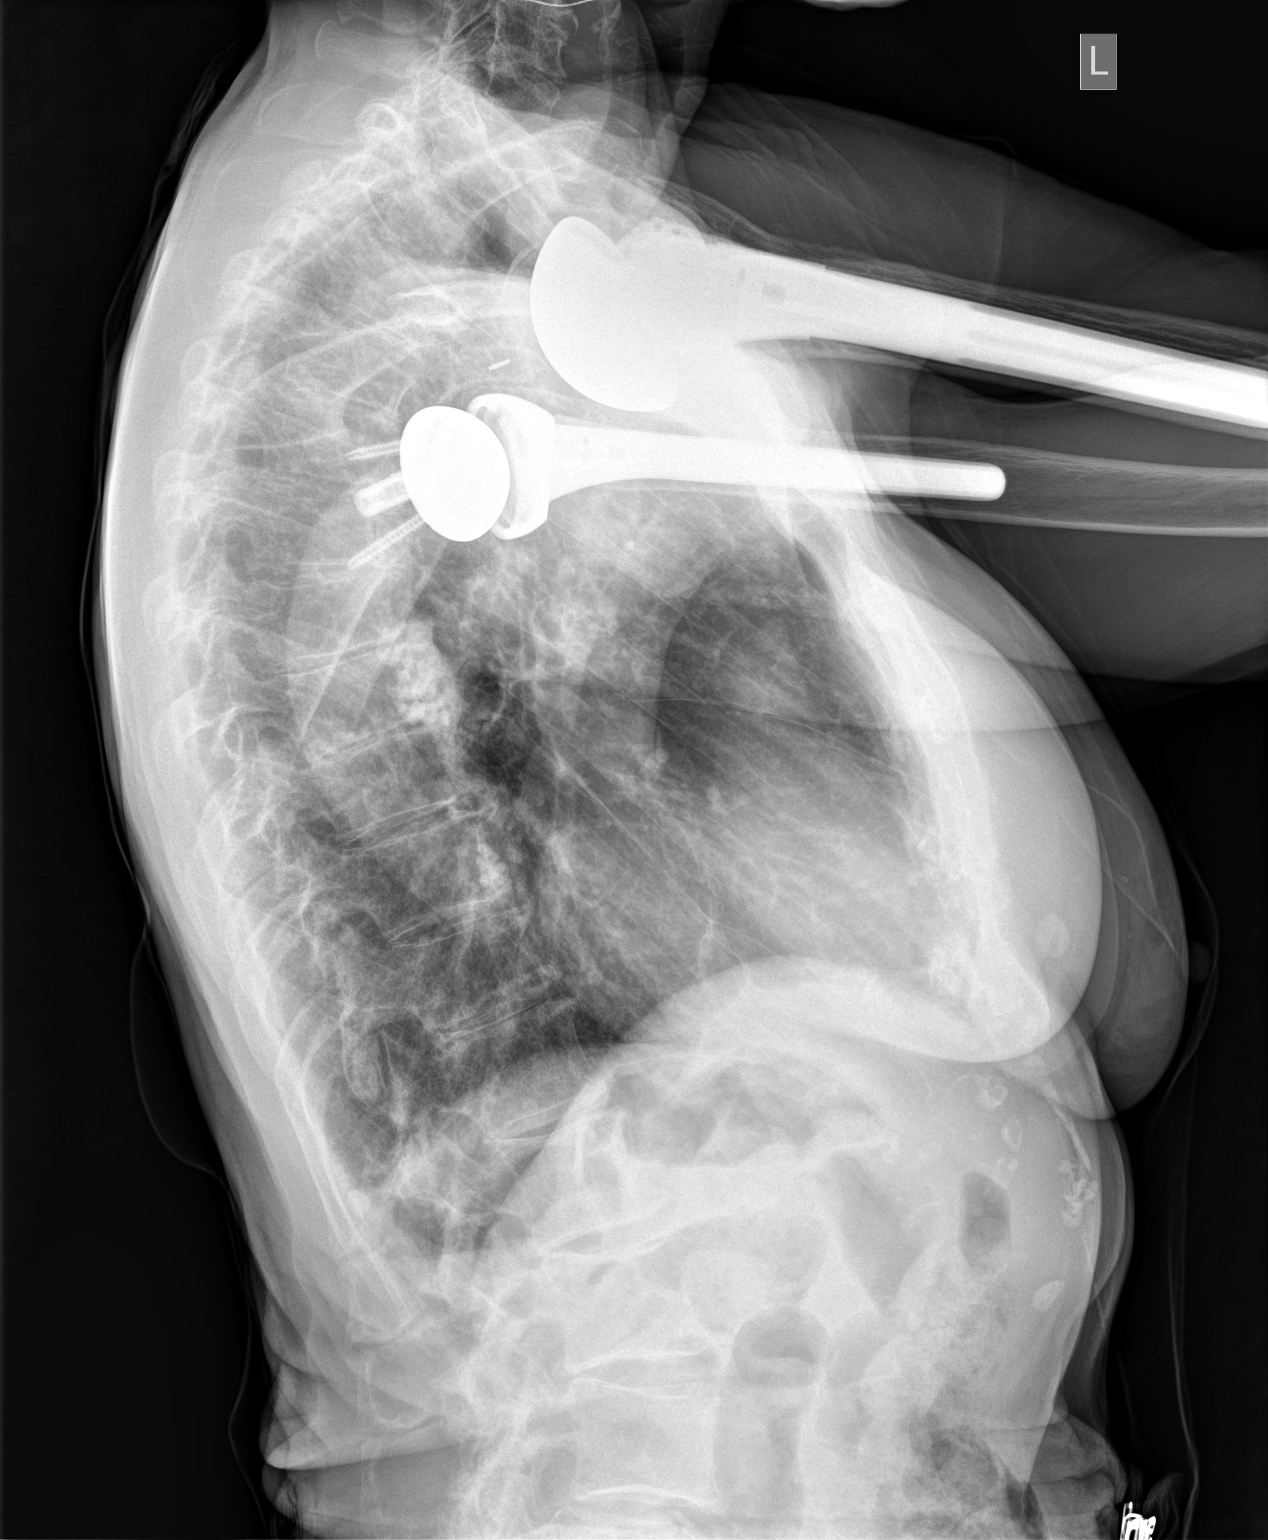

[2 of 2 positions shown; findings below may reference images not displayed]

FINDINGS: No consolidation. Prior granulomatous disease of the chest. No 
effusion. Normal cardiac size and pulmonary vascularity. No pneumothorax. No 
acute osseous abnormality. Bilateral total shoulder arthroplasties. Osteopenia.
IMPRESSION: No acute cardiopulmonary findings.

## 2022-08-27 IMAGING — DX SHOULDER 2 VIEWS RIGHT
3 series · 3 of 3 positions shown · non-contrast
Comparison: None.

________________________________________________________________________________________________ 
SHOULDER 2 VIEWS RIGHT, 08/27/2022 [DATE]: 
CLINICAL INDICATION: Right shoulder pain.

[AP (1 of 3)]
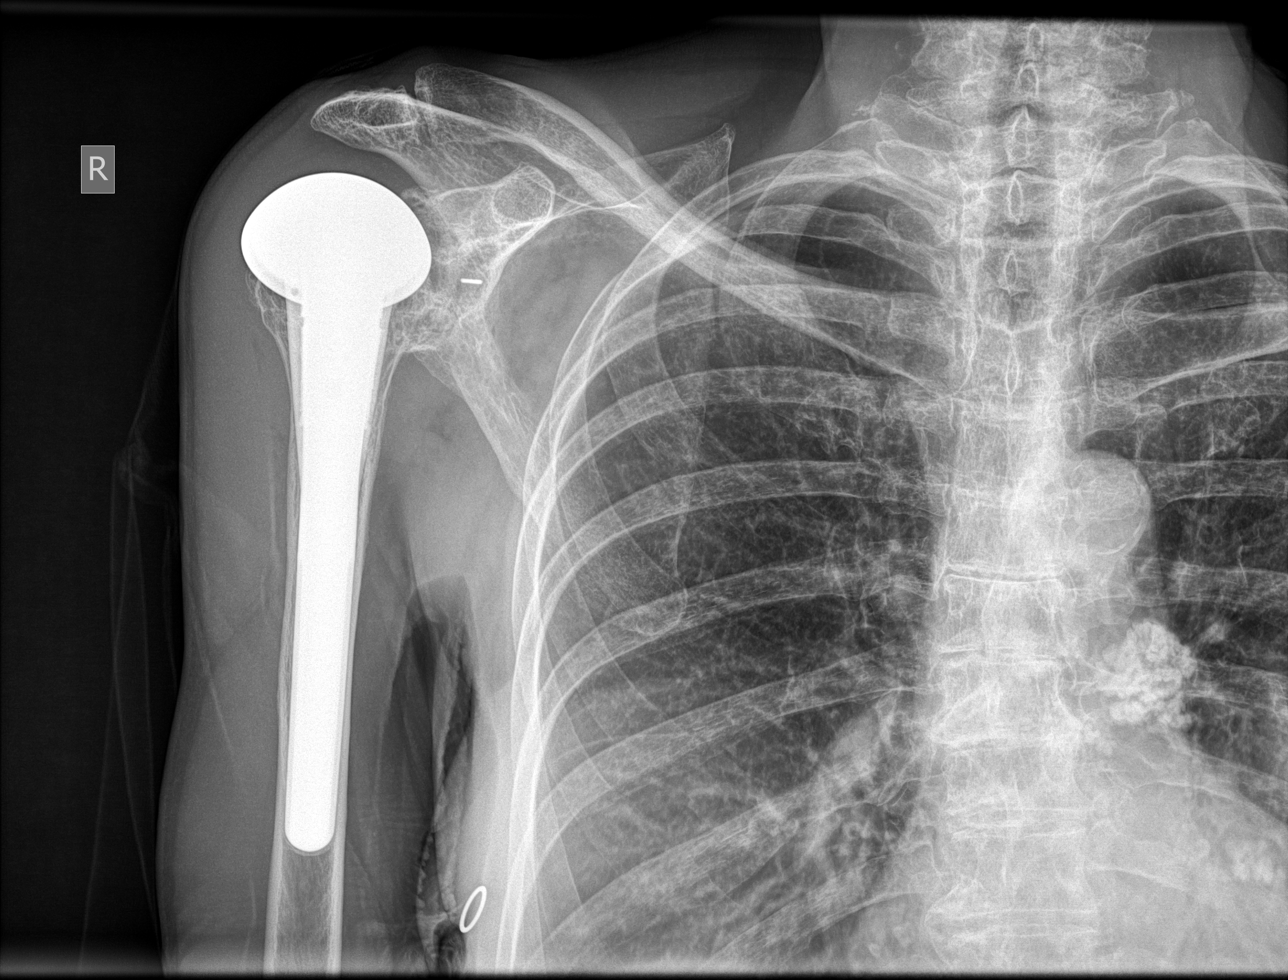

[AP (2 of 3)]
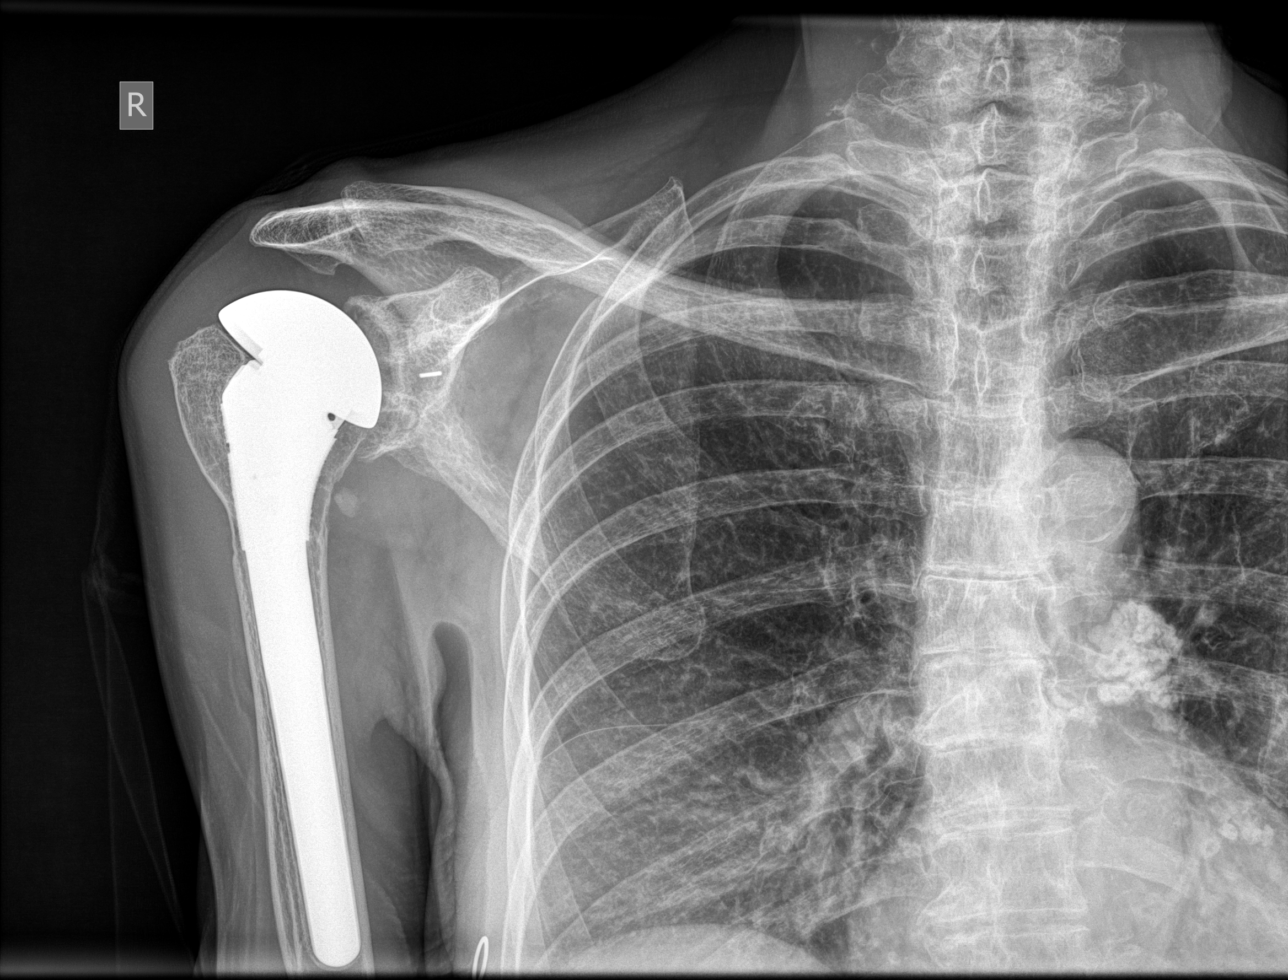

[AP (3 of 3)]
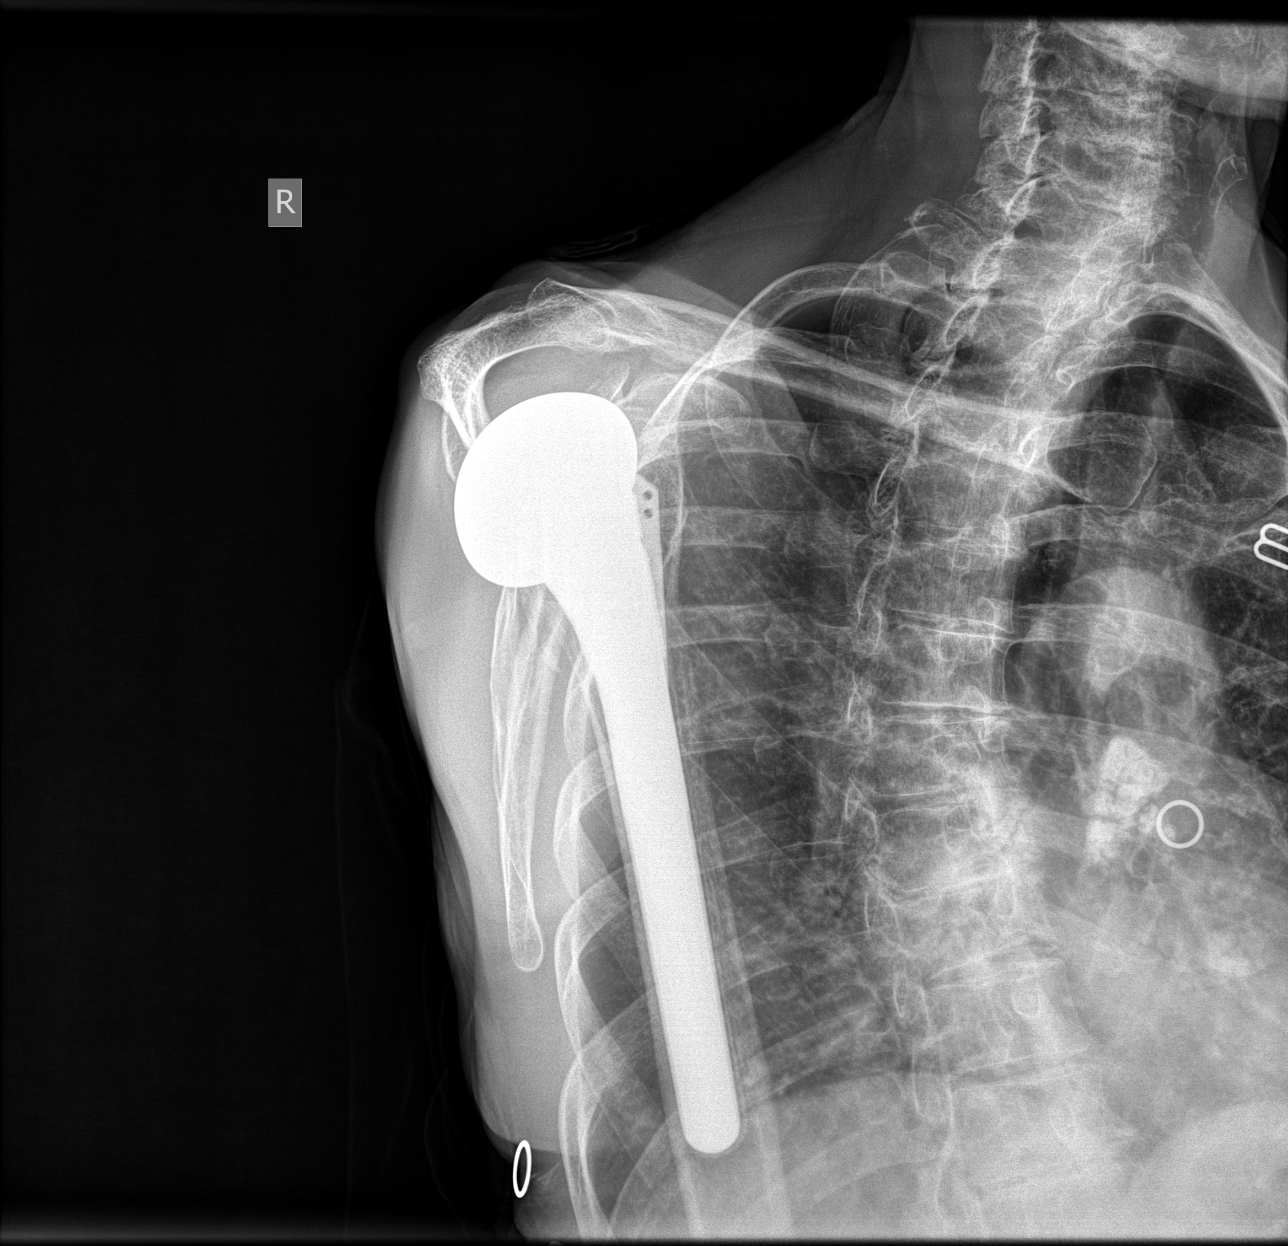

[3 of 3 positions shown; findings below may reference images not displayed]

FINDINGS: Right total shoulder arthroplasty (TSA) with cemented glenoid 
component and noncemented humeral component. No humeral component loosening. Up 
to 1 mm zone of lucency about the bone-cement interface of the glenoid component 
without definite evidence of loosening. 0.7 cm cement fragment versus 
intra-articular body medial to the humeral neck. Mild AC joint degenerative 
change. No fractures or dislocations. Degenerative change of the spine. 
Osteopenia. Prior granulomatous disease of the chest. Atherosclerosis. Breast 
implant.
IMPRESSION: 1.  Right TSA without periprosthetic fracture or definite evidence of loosening. 
2.  Osteopenia: DXA may be helpful for further evaluation.

## 2022-08-28 IMAGING — MR MRI CERVICAL SPINE WITHOUT CONTRAST
4 of 7 series · 23 of 48 positions shown · IV contrast (gadolinium)
Comparison: None.

________________________________________________________________________________________________ 
MRI CERVICAL SPINE WITHOUT CONTRAST, 08/28/2022 [DATE]: 
CLINICAL INDICATION: Right-sided neck pain that radiates down right arm. History 
of falls.
TECHNIQUE: Multiplanar, multiecho position MR images of the cervical spine were 
performed without intravenous gadolinium enhancement. Patient was scanned on a 
1.5T magnet.

[Series 101: survey · axial · 10.0mm · 1.25mm/px · z∈[-6,+200]mm · 3 of 10 slices shown]
[im 1/10]
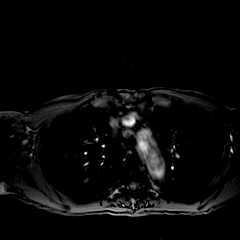
[im 5/10]
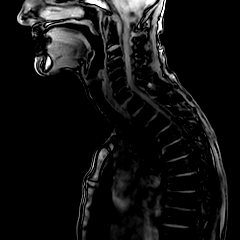
[im 10/10]
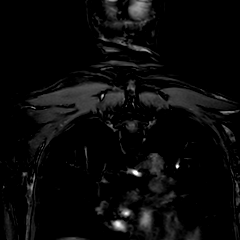

[Series 201: t2w_cor-surv · coronal · 5.0mm · 0.69mm/px · 3 of 6 slices shown]
[im 1/6]
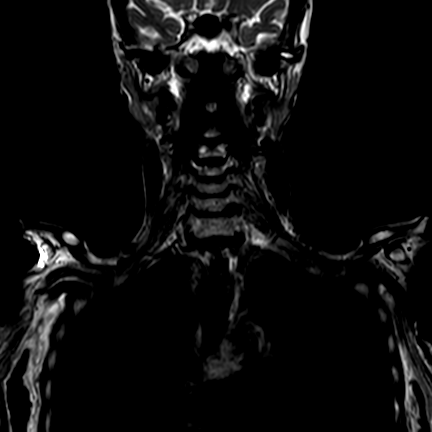
[im 3/6]
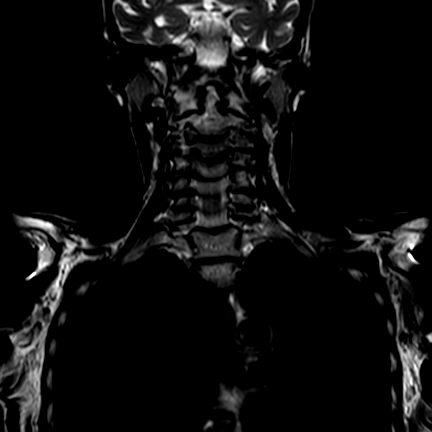
[im 6/6]
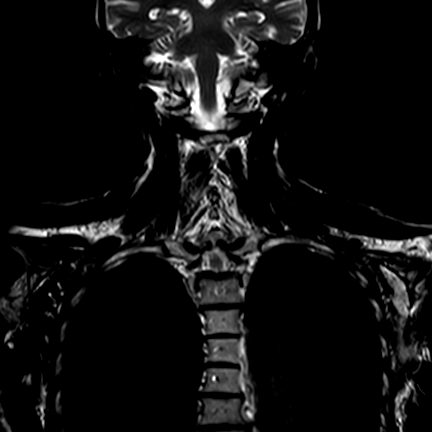

[Series 301: T1 · sagittal · 3.0mm · 0.37mm/px · 6 of 15 slices shown]
[im 1/15]
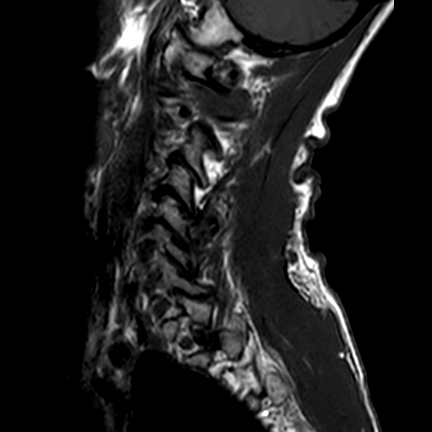
[im 3/15]
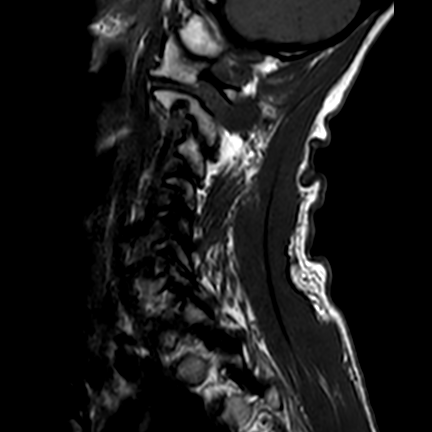
[im 6/15]
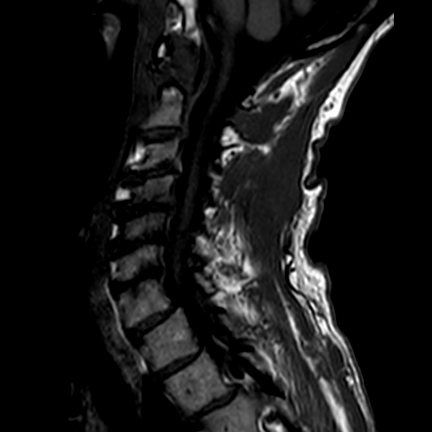
[im 9/15]
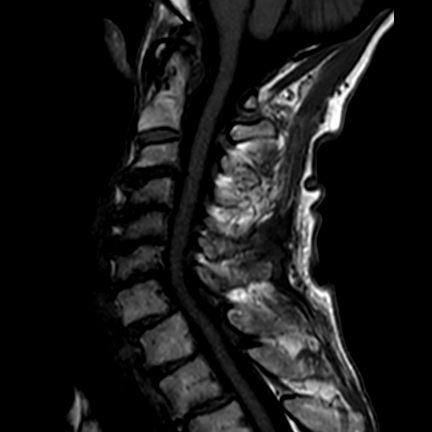
[im 12/15]
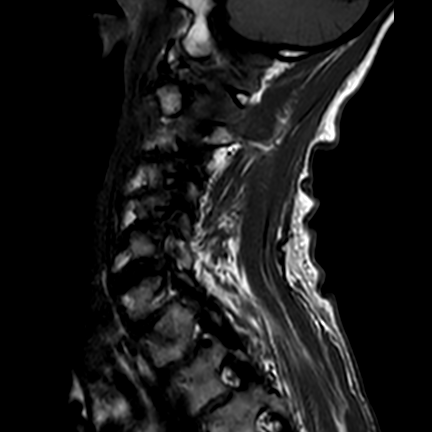
[im 15/15]
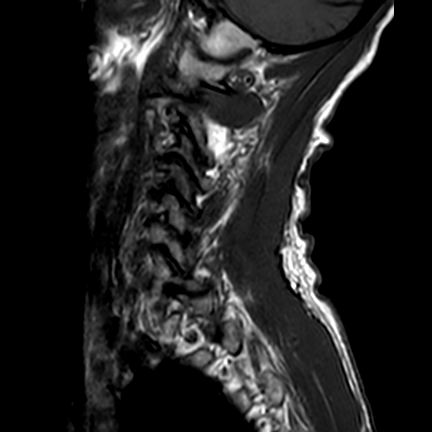

[Series 601: T2 · axial · 3.0mm · 0.31mm/px · z∈[+21,+103]mm · 11 of 28 slices shown]
[im 1/28]
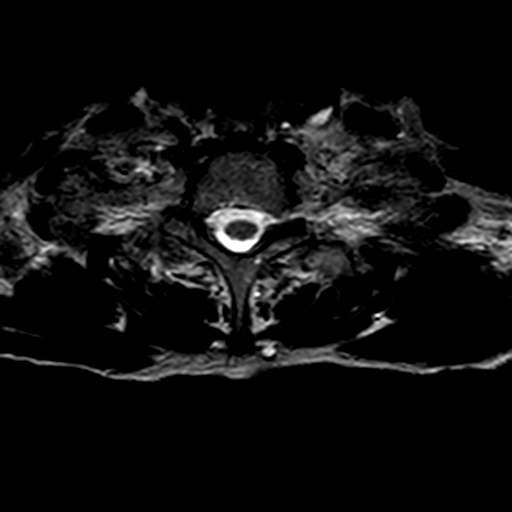
[im 3/28]
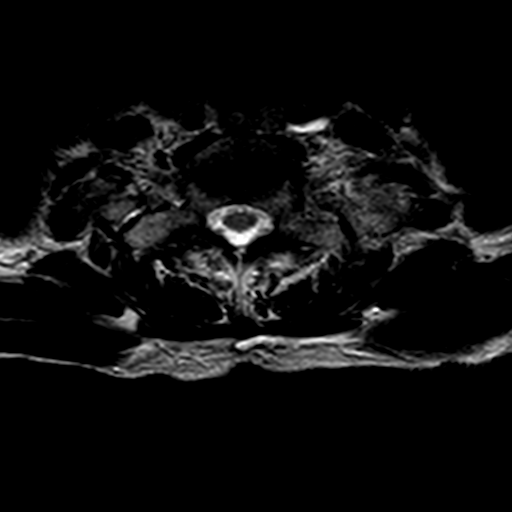
[im 6/28]
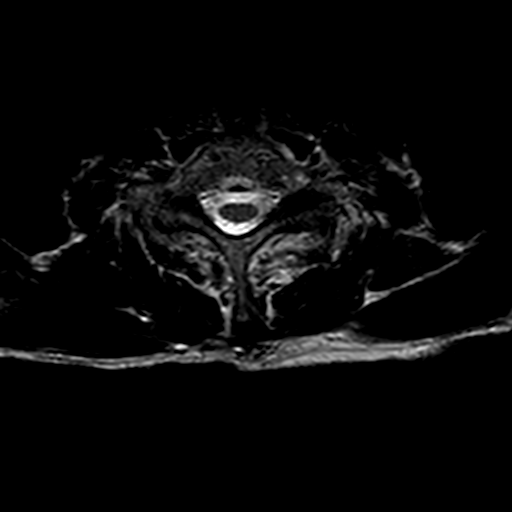
[im 9/28]
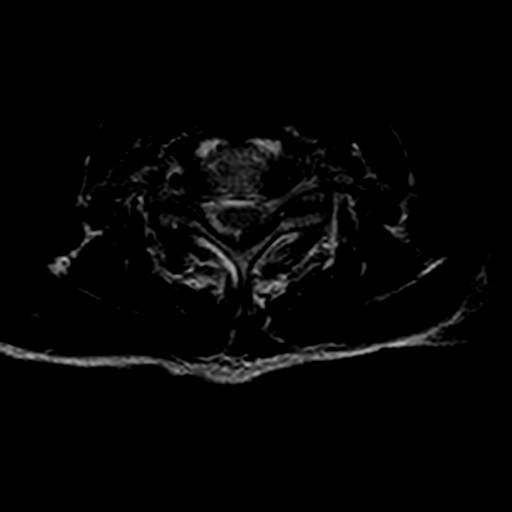
[im 11/28]
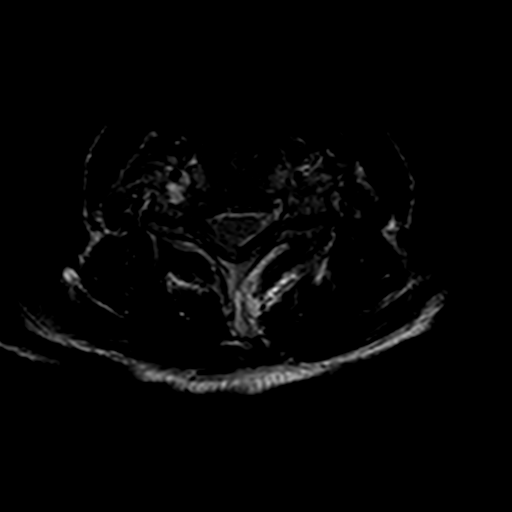
[im 14/28]
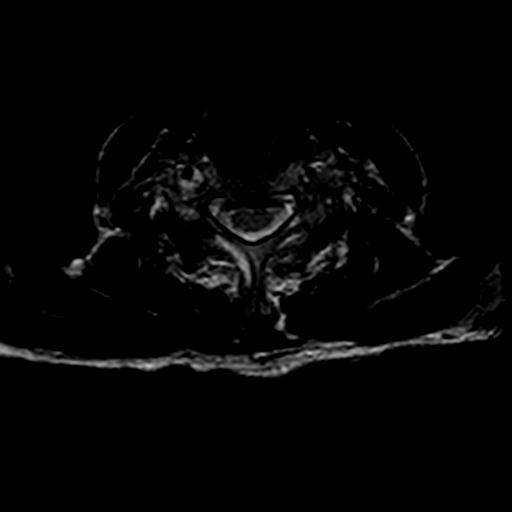
[im 17/28]
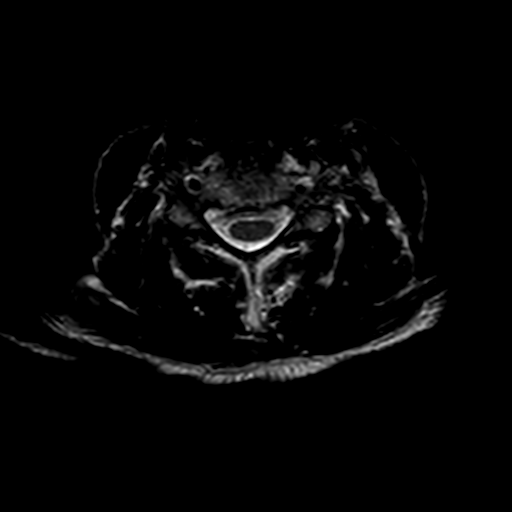
[im 19/28]
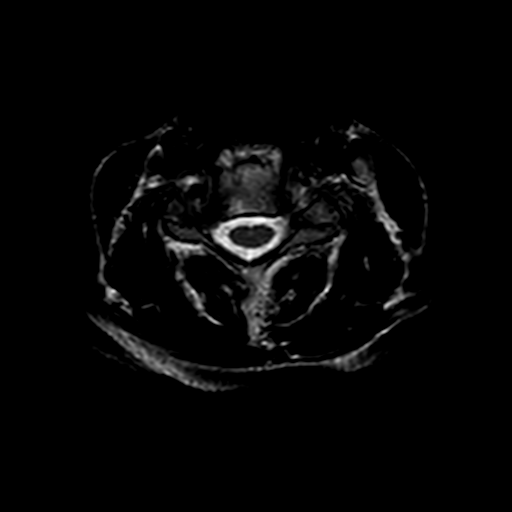
[im 22/28]
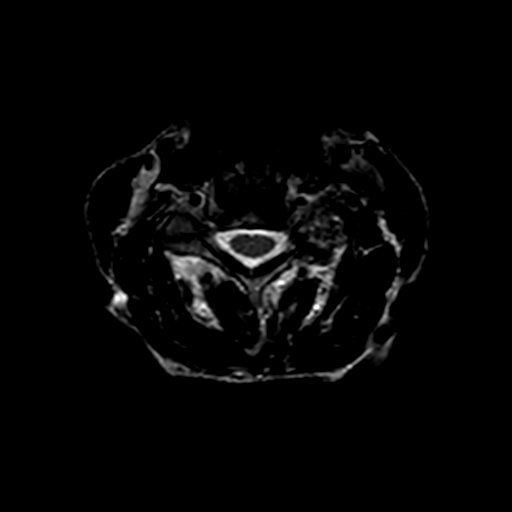
[im 25/28]
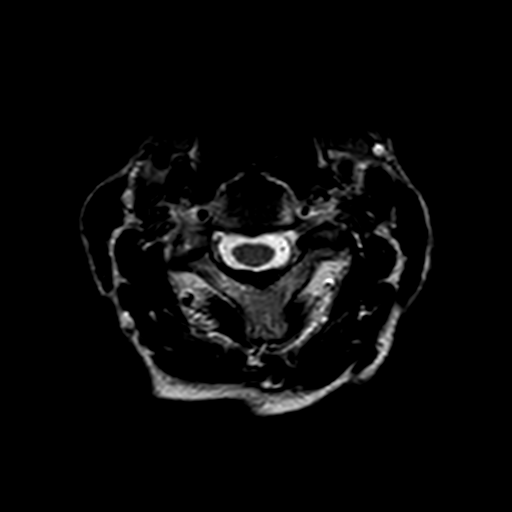
[im 28/28]
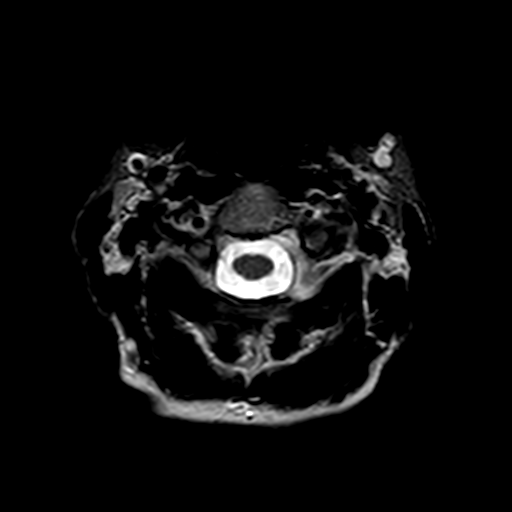

[23 of 48 positions shown; findings below may reference images not displayed]

FINDINGS: -------------------------------------------------------------------------------- 
----------------- 
GENERAL: 
ALIGNMENT: Mild retrolisthesis of C3 on C4, C4-C5, C5 on C6, C6 on C7. Mild 
anterolisthesis of C7 on T1. 
VERTEBRAL BODY HEIGHT: Normal.  
MARROW SIGNAL: Very mild edema along the right side of C2 odontoid base likely 
from degenerative change. 
CORD SIGNAL: Normal.  
ADDITIONAL FINDINGS: None. 
-------------------------------------------------------------------------------- 
---------------- 
SEGMENTAL: 
CRANIOCERVICAL JUNCTION: No significant stenosis. 
C2-C3: Disc bulge. Bilateral uncovertebral joint and facet hypertrophy. Mild 
canal narrowing. No significant neural foraminal narrowing. 
C3-C4: Loss of disc height with disc osteophyte complex. Bilateral uncovertebral 
joint hypertrophy. Left facet hypertrophy. Mild central canal narrowing. 
Moderate left neural foraminal narrowing. Mild right neural foraminal narrowing. 
C4-C5: Loss of disc height with disc osteophyte complex. Right greater than left 
uncovertebral joint hypertrophy. Mild central canal narrowing. Severe right 
greater than left neural foraminal narrowing. 
C5-C6: Loss of disc height with disc osteophyte complex. This abuts and mildly 
deforms the ventral cord. Mild central canal narrowing. Bilateral uncovertebral 
joint hypertrophy. Severe right greater than left neural foraminal narrowing. 
C6-C7: Loss of disc height with disc osteophyte complex and bilateral 
uncovertebral joint hypertrophy. Mild central canal narrowing. Severe bilateral 
neural foraminal narrowing. 
C7-T1: Uncovering of the disc space with bilateral facet hypertrophy. No 
significant central canal narrowing. No significant neural foraminal narrowing. 
-------------------------------------------------------------------------------- 
---------------
IMPRESSION: 1.  Discogenic/degenerative changes as above. 
2.  Cord deformity: C6-C7 
3.  Severe central canal narrowing: None. 
4.  Severe neural foraminal narrowing: C4-C5 through C6-C7

## 2022-10-01 ENCOUNTER — Ambulatory Visit: Admit: 2022-10-01 | Discharge: 2022-10-08 | Payer: MEDICARE

## 2022-10-01 DIAGNOSIS — F5081 Binge eating disorder: Secondary | ICD-10-CM

## 2022-10-01 NOTE — Progress Notes (Signed)
This was a phone visit per the patient's request.    [facility]--Dr Rivka Spring continues to refill her lorazepam. There is no indication of lorazepam misuse     This is our first clinical contact in many months. She was started Ozempic and is now receiving 1 mg/week.  She is also taking bupropion 300 mg/d and lorazepam 1 mg/d. She stopped fluoxetine with no problems, Her mood is stable with no hypomanic or depressive symptoms. She has stopped binge eating. Her energy, focus, and concentration are good. She sleeps well as long as she takes 1 mg lorazepam qHS. She has stopped biting her nails. Her weight today is 110 lbs.  She is functioning well and continues to have great relationships with her children and grandchildren.    Time: 45 minutes; Psychotherapy 30 minutes     Psychotherapy:  Discussed continuing Ozempic 1mg /week and how she is longer binge eatiing or obsessing about food.I again reminded her that this is an off label use. . Warned her of potential side effects of pancreatitis and hypoglycemia. Her grandson Ree Kida is doing very well.    MSE: Pt is alert, Ox3, and pleasant; mood and affect are normal; speech is normal; there is no SI, HI, formal thought disorder, dels, or hals; memory is grossly intact; intelligence is estimated to be above average; insight is fair, judgement is fair-good    ROS: She remains in good medical health. 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 She is tolerating the bupropion, Prozac 40 mg/d, and lorazepam well. She has no nausea, abdominal pain, balance difficulties, or abnormal movements, including no tremor. She continues to have some mild word finding difficulties but it does not affect her function. She conts to exercise regularly (1-1.5 hours/day). There has been no vomiting, purging, or excessive exercise. She is eating 3 healthy meals per day.    Medications:  Wellbutrin XL 300 mg daily  Lorazepam 1 mg qHS--now prescribed by Dr Rivka Spring  Ozempic 1 mg q  week    Labs:  10/01/23 wt=110 lbs  10/29/20 wt=116.8 lbs  01/11/20 wt=118 lbs  05/25/19 wt=122 lbs  05/31/18 wt=114.8 lbs  11/16/17 wt=112.9 lbs  10/29/16 wt=118.2 lbs  03/03/16 wt=128.0  02/26/15 wt=126 lbs  07/05/14 wt=129.4 lbs  06/28/14 Li=.2, TSH=.88, T4=5.18    Diagnoses:  Binge eating disorder  Bipolar II disorder  ADD  Insomnia    Assessment and Management Plan (Prescribers):   1. dysregulated eating behavior -  Cont Ozempic 1mg /week;  Cont Wellbutrin XL 300 mg/. She is to return in 1 month and will call me sooner if she needs to.  2. depressive symptoms - cont Wellbutrin XL   3. insomnia-cont lorazepam 1 mg every night (now prescribed by her internist Dr.Maeder; cont to stress good sleep hygiene; again warned of side effects of sedation, confusion, and impaired driving  4. impaired attention and focus - Cont Wellbutrin XL 300 mg/d   5. Possible excessive worrying-Cont wellbutrin 300 mg/d  6. mild obsessive compulsive symptoms (nail biting) - This has resolved on Ozempic  8. 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 Will cont to hold lithium.  9. One episode of hypoglycemia on Trulicity. No evidence for this on Ozempic. Marland Kitchen Pt again warned of this possibility.

## 2022-12-07 ENCOUNTER — Ambulatory Visit: Admit: 2022-12-08 | Discharge: 2022-12-15 | Payer: PRIVATE HEALTH INSURANCE

## 2022-12-07 DIAGNOSIS — F5081 Binge eating disorder: Secondary | ICD-10-CM

## 2022-12-07 DIAGNOSIS — F50819 Binge eating disorder, unspecified: Secondary | ICD-10-CM

## 2022-12-07 NOTE — Progress Notes (Signed)
This was a phone visit per the patient's request. She was in [place] at the time of the call.    [facility]--Dr Rivka Spring continues to refill her lorazepam. There is no indication of lorazepam misuse     This patient contacted me yesterday because she had some brief depressive symptoms. She is taking  bupropion 300 mg/d, lorazepam 1 mg/d, and Ozempic 1 mg about every 10 days. She remains off fluoxetine, and does not want to go back on it because it makes her throw up. She now feels good with no depressive or hypomanic or symptoms. She has stopped binge eating. Her energy, focus, and concentration are good. She sleeps well as long as she takes 1 mg lorazepam qHS. She has stopped biting her nails. Her weight today is 110 lbs.  She is functioning well and continues to have great relationships with her children and grandchildren.    Time: 27 minutes; Psychotherapy 16 minutes     Psychotherapy:  Again discussed continuing Ozempic 1mg /week and how she is longer binge eating or obsessing about food.I yet again reminded her that this is an off label use.  Warned her of potential side effects of pancreatitis and hypoglycemia. Her children and grandchildren are doing well.    MSE: Pt is alert, Ox3, and pleasant; mood and affect are normal; speech is normal; there is no SI, HI, formal thought disorder, dels, or hals; memory is grossly intact; intelligence is estimated to be above average; insight is fair, judgement is fair-good    ROS: She is in good medical health. 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 She is tolerating the bupropion, lorazepam, and Ozempic  well. She has no nausea, abdominal pain, balance difficulties, or abnormal movements, including no tremor. She continues to have some mild word finding difficulties but it does not affect her function. She conts to exercise regularly (1-1.5 hours/day). There has been no vomiting, purging, or excessive exercise. She is eating 3 healthy meals per  day.    Medications:  Wellbutrin XL 300 mg daily  Lorazepam 1 mg qHS--now prescribed by Dr Rivka Spring  Ozempic 1 mg q week    Labs:  12/08/22 wt=110 lbs  10/01/22 wt=110 lbs  10/29/20 wt=116.8 lbs  01/11/20 wt=118 lbs  05/25/19 wt=122 lbs  05/31/18 wt=114.8 lbs  11/16/17 wt=112.9 lbs  10/29/16 wt=118.2 lbs  03/03/16 wt=128.0  02/26/15 wt=126 lbs  07/05/14 wt=129.4 lbs  06/28/14 Li=.2, TSH=.88, T4=5.18    Diagnoses:  Binge eating disorder  Bipolar II disorder  ADD  Insomnia    Assessment and Management Plan (Prescribers):   1. dysregulated eating behavior -  Cont Ozempic 1mg /week;  Cont Wellbutrin XL 300 mg/. She is to return in 1 month and will call me sooner if she needs to.  2. depressive symptoms - cont Wellbutrin XL   3. insomnia-cont lorazepam 1 mg every night (now prescribed by her internist Dr.Maeder; cont to stress good sleep hygiene; again warned of side effects of sedation, confusion, and impaired driving  4. impaired attention and focus - Cont Wellbutrin XL 300 mg/d   5. Possible excessive worrying-Cont wellbutrin 300 mg/d  6. mild obsessive compulsive symptoms (nail biting) - This has resolved on Ozempic  8. 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 Will cont to hold lithium.  9. One episode of hypoglycemia on Trulicity. No evidence for this on Ozempic. Marland Kitchen Pt again warned of this possibility.

## 2023-02-22 ENCOUNTER — Ambulatory Visit: Admit: 2023-02-23 | Discharge: 2023-03-09 | Payer: MEDICARE

## 2023-02-22 DIAGNOSIS — F5081 Binge eating disorder: Secondary | ICD-10-CM

## 2023-02-22 DIAGNOSIS — F50819 Binge eating disorder, unspecified: Secondary | ICD-10-CM
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
## 2023-03-01 ENCOUNTER — Ambulatory Visit: Admit: 2023-03-02 | Discharge: 2023-03-08 | Payer: MEDICARE

## 2023-03-01 DIAGNOSIS — F3181 Bipolar II disorder: Secondary | ICD-10-CM

## 2023-03-01 NOTE — Plan of Care (Signed)
OP Treatment Plan    Nicole Odonnell  [id number]    [facility] TREATMENT PLAN:   General Goals:  Reduce risk of symptomatic/syndromic/functional relapse or recurrence. Patient will remain psychiatrically stable by reducing or stabilizing his/her signs and symptoms of mental illness and maximizing his/her level of independence. Patient will be able to recognize, accept, and manage his/her mental health and/or substance misuse disorders, including working with the medical staff to manage his/her medications. Improve functioning/maintain functional improvement. Patient will verbalize insight into the need and importance of receiving care for their symptoms. Patient will report stabilization of body weight and image.  Med/Som Specific:  Patient will attend all scheduled appointments with MD/NP. Patient will engage in an informed consent discussion regarding medications for the treatment of their symptoms. Patient will report any side effect issues related to the medications. Patient will agree to have MD/NP collaborate with other health professional as needed to ensure optimal care.  Therapy Specific:  Patient will attend and be an active participant in individual therapy. Patient will address psychosocial stresses that impact development of symptoms in therapy. Patient will demonstrate increased knowledge and implementation of coping strategies to reduce symptoms and improve functioning.  Expected Frequency of Visits:  Q 4 months/Ongoing  Prognosis:  Good
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
## 2023-12-11 LAB — URINALYSIS, REFLEX TO CULTURE
Bilirubin, UA: NEGATIVE
Glucose, UA: NEGATIVE
Ketones, UA: NEGATIVE
Leukocyte Esterase, UA: NEGATIVE
Nitrite, UA: NEGATIVE
Protein, UA: NEGATIVE mg/dL
RBC, UA: 32 /HPF — ABNORMAL HIGH (ref 0–3)
Specific Gravity, Urine: 1.021 NA (ref 1.005–1.035)
Squam Epithel, UA: <1 /HPF (ref 0–5)
Urobilinogen, UA: <2 mg/dL (ref <2.0)
WBC, UA: 21 /HPF — ABNORMAL HIGH (ref 0–5)
pH, UA: 6 NA (ref 5.0–8.0)

## 2023-12-27 ENCOUNTER — Telehealth: Admit: 2023-12-29 | Payer: MEDICARE

## 2023-12-27 DIAGNOSIS — F50819 Binge eating disorder, unspecified: Secondary | ICD-10-CM

## 2023-12-27 NOTE — Progress Notes (Addendum)
 This was a phone visit per my  request. Pt was unable to connect for a telehealth visit as was scheduled. The patient was in [place] at the time of the call.    [facility]--Dr Delories Fetter continues to refill her lorazepam. There is no indication of lorazepam misuse     This patient reports that she is doing very well--she is not binge eating, she eats 3 meals every day, and her mood, energy and sleep are normal. Her weight is 113 lbs. .  She is functioning well and continues to have great relationships with her children and grandchildren.    Time: 20 minutes     Psychotherapy:  Discussed continuing Ozempic 1mg /week.  Again warned her of potential side effects of pancreatitis and hypoglycemia. Also reminded her that we are using  Ozempic for an an off label purpose (BED). Family is doing well. Her dog Nicole Odonnell had successful surgery.     MSE: Pt is alert, Ox3, and pleasant; mood and affect are normal; speech is normal; there is no SI, HI, formal thought disorder, dels, or hals; memory is grossly intact; intelligence is estimated to be above average; insight is fair, judgement is fair-good    ROS: She remains in good medical health. However, she recently had a kidney stone, which she has recoverd from with no problems. 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 She is tolerating the bupropion, fluoxetine , lorazepam, and Ozempic  well. She has no nausea, abdominal pain, balance difficulties, or abnormal movements, including no tremor. She continues to have some mild word finding difficulties but it does not affect her function. She conts to exercise regularly (1-1.5 hours/day). There has been no vomiting, purging, or excessive exercise. She is eating 3 healthy meals per day. She is happy with her weight of 113 lbs. There have been no episodes of possible hypoglycemia.     Medications:  Wellbutrin XL 300 mg daily  Fluoxetine  40 mg/d  Lorazepam 1 mg qHS--now prescribed by Dr Delories Fetter  Ozempic 1 mg q  week    Labs:  12/27/23 wt=113 lbs  8/13 24 wt =112 lbs  12/08/22 wt=110 lbs  10/01/22 wt=110 lbs  10/29/20 wt=116.8 lbs  01/11/20 wt=118 lbs  05/25/19 wt=122 lbs  05/31/18 wt=114.8 lbs  11/16/17 wt=112.9 lbs  10/29/16 wt=118.2 lbs  03/03/16 wt=128.0  02/26/15 wt=126 lbs  07/05/14 wt=129.4 lbs  06/28/14 Li=.2, TSH=.88, T4=5.18    Diagnoses:  Binge eating disorder  Bipolar II disorder  ADD  Insomnia    Assessment and Management Plan (Prescribers):   1. dysregulated eating behavior -  Cont Ozempic 1mg /week;  Cont Wellbutrin XL 300 mg/, cont fluoxetine  to 40 mg/weeek. She is to return in 4 weeks or call me sooner if she needs to.  2. depressive symptoms - cont Wellbutrin XL, fluoxetine  40 mg/d  3. insomnia-cont lorazepam 1 mg every night (now prescribed by her internist Dr.Maeder; cont to stress good sleep hygiene; again warned of side effects of sedation, confusion, and impaired driving  4. impaired attention and focus - Cont Wellbutrin XL 300 mg/d   5. Possible excessive worrying-Cont wellbutrin 300 mg/d, fluoxetine  40 mg/d  6. mild obsessive compulsive symptoms (nail biting) - This has resolved on Ozempic  8. 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 Will cont to hold lithium.  9. One episode of hypoglycemia on Trulicity. No evidence for this on Ozempic. Nicole Odonnell Pt again warned of this possibility.
